# Patient Record
Sex: Male | Born: 1975
Health system: Southern US, Community
[De-identification: ages and names within clinical notes are randomized; demographics above are authoritative.]

## PROBLEM LIST (undated history)

## (undated) DIAGNOSIS — K5792 Diverticulitis of intestine, part unspecified, without perforation or abscess without bleeding: Secondary | ICD-10-CM

## (undated) DIAGNOSIS — M109 Gout, unspecified: Secondary | ICD-10-CM

## (undated) HISTORY — DX: Gout, unspecified: M10.9

## (undated) HISTORY — PX: HERNIA REPAIR: SHX51

## (undated) HISTORY — DX: Diverticulitis of intestine, part unspecified, without perforation or abscess without bleeding: K57.92

---

## 1999-03-23 ENCOUNTER — Ambulatory Visit (HOSPITAL_COMMUNITY): Admission: RE | Admit: 1999-03-23 | Discharge: 1999-03-23 | Payer: Self-pay | Admitting: Surgery

## 2001-01-29 ENCOUNTER — Ambulatory Visit (HOSPITAL_COMMUNITY): Admission: RE | Admit: 2001-01-29 | Discharge: 2001-01-29 | Payer: Self-pay | Admitting: Surgery

## 2006-06-29 ENCOUNTER — Emergency Department (HOSPITAL_COMMUNITY): Admission: EM | Admit: 2006-06-29 | Discharge: 2006-06-29 | Payer: Self-pay | Admitting: Emergency Medicine

## 2007-09-26 IMAGING — CT CT PELVIS W/ CM
2 of 5 series · 17 of 46 positions shown, 19 images · IV contrast ([ID]/WATER & 150 ML OMNI 300)
Comparison: none

CLINICAL DATA: Lower abdominal pain, fever and nausea.      
 ABDOMEN CT WITH CONTRAST:
TECHNIQUE: Multidetector CT imaging of the abdomen was performed following the standard protocol during bolus administration of intravenous contrast.
 Contrast:  Dilute oral contrast and IV administration of 150 cc Omnipaque 300.
TECHNIQUE: Multidetector CT imaging of the pelvis was performed following the standard protocol during bolus administration of intravenous contrast.

[Series 2: routine abdomen · axial · 0.98mm/px · z∈[-524,-30]mm · 14 of 111 slices shown, 16 images]
[im 6/111  soft-tissue]
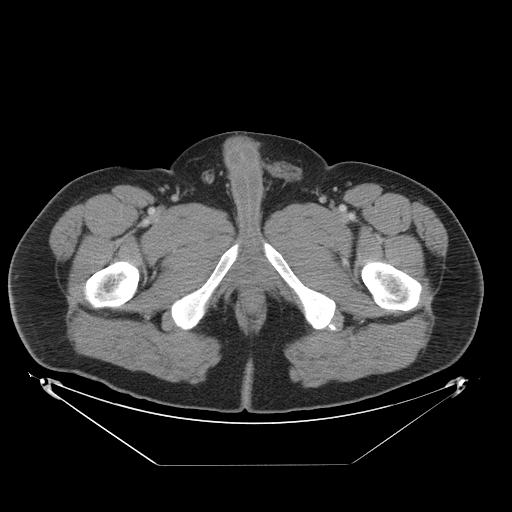
[im 6/111  bone]
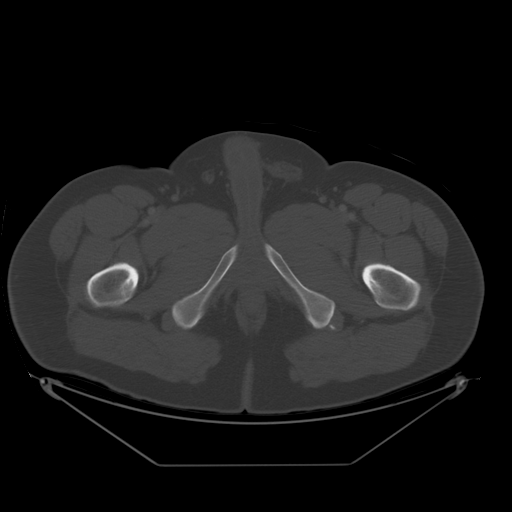
[im 17/111  soft-tissue]
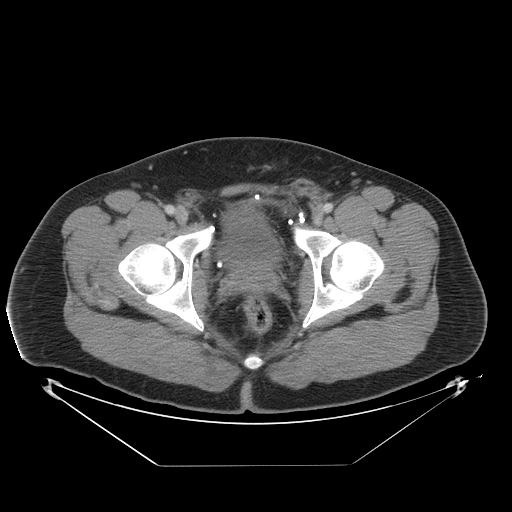
[im 23/111  soft-tissue]
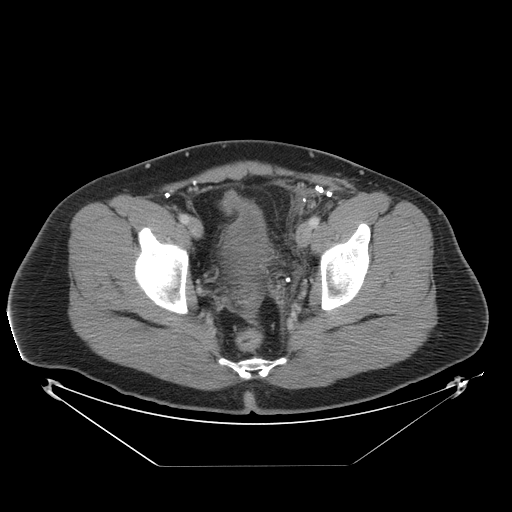
[im 28/111  soft-tissue]
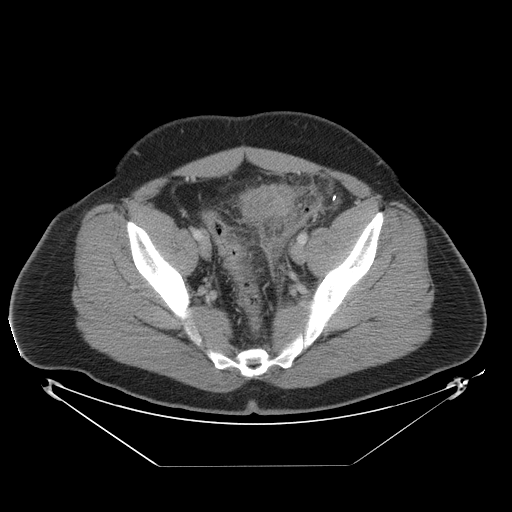
[im 39/111  soft-tissue]
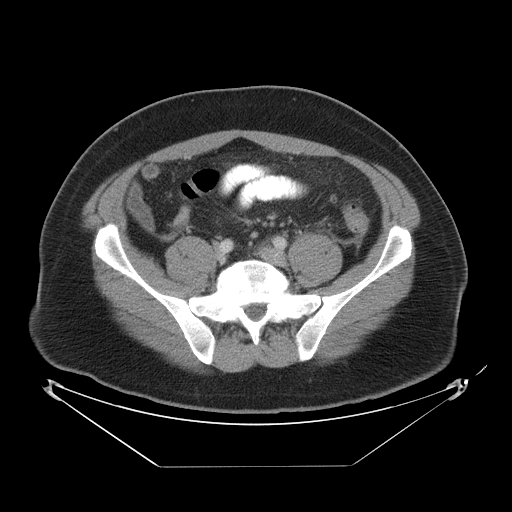
[im 45/111  soft-tissue]
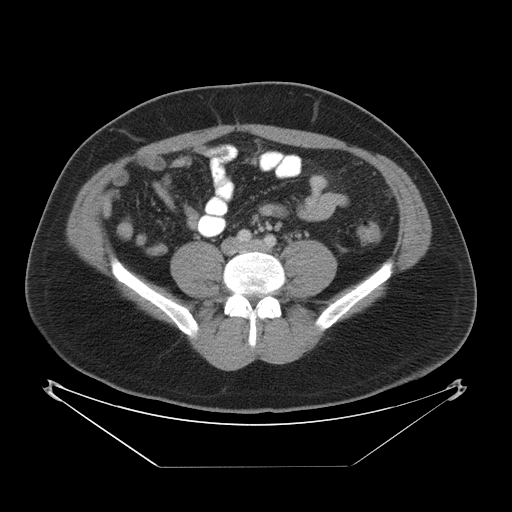
[im 50/111  soft-tissue]
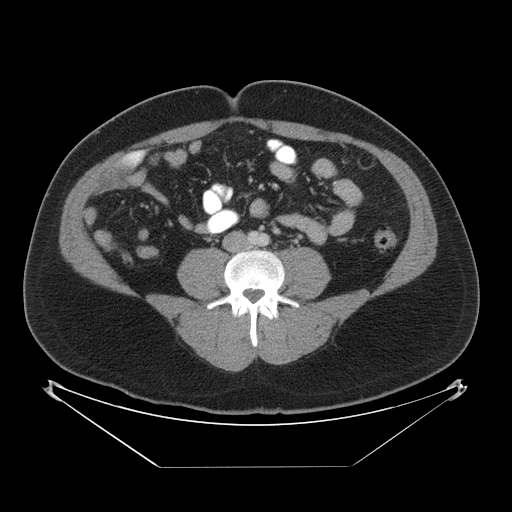
[im 61/111  soft-tissue]
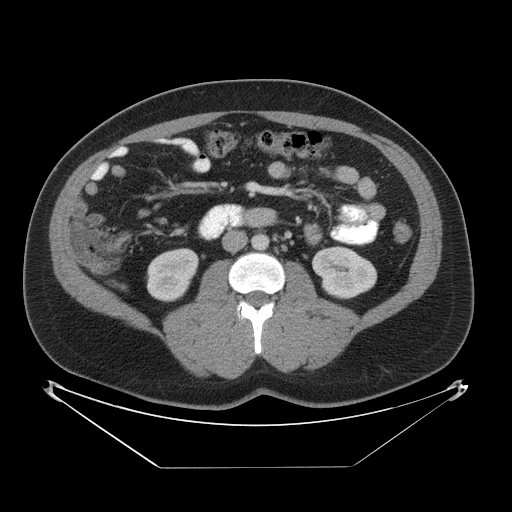
[im 67/111  soft-tissue]
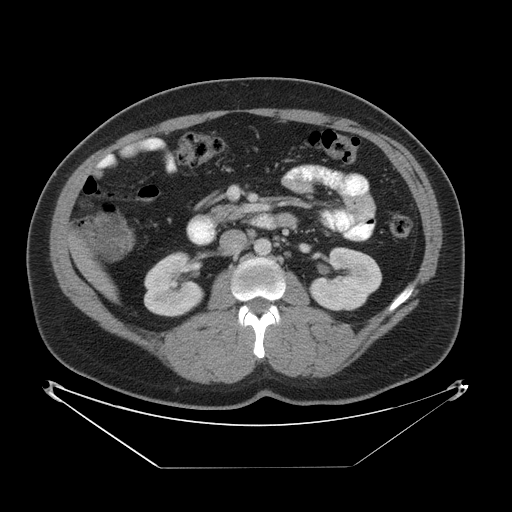
[im 67/111  bone]
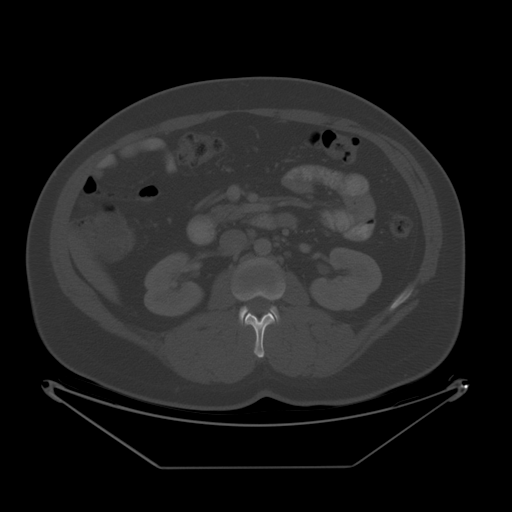
[im 72/111  soft-tissue]
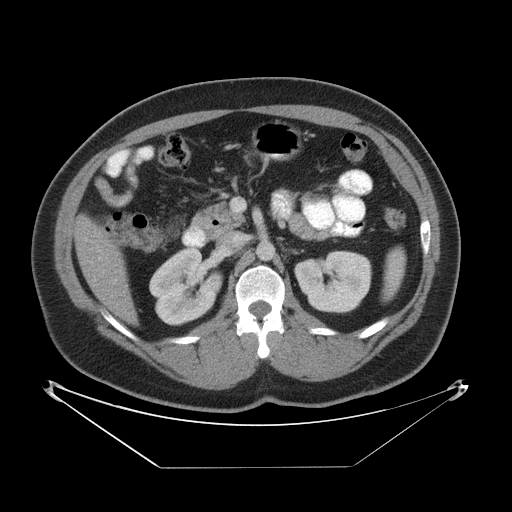
[im 83/111  soft-tissue]
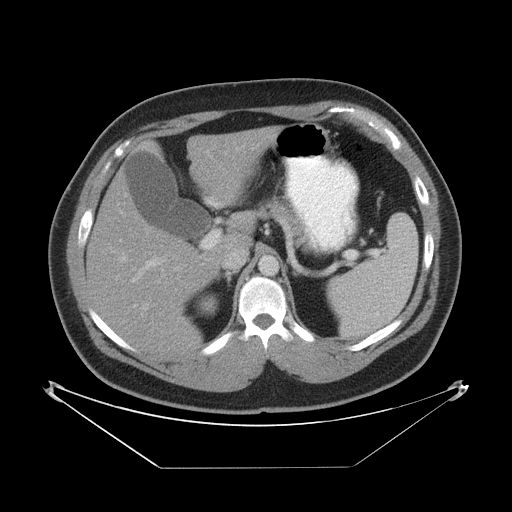
[im 89/111  soft-tissue]
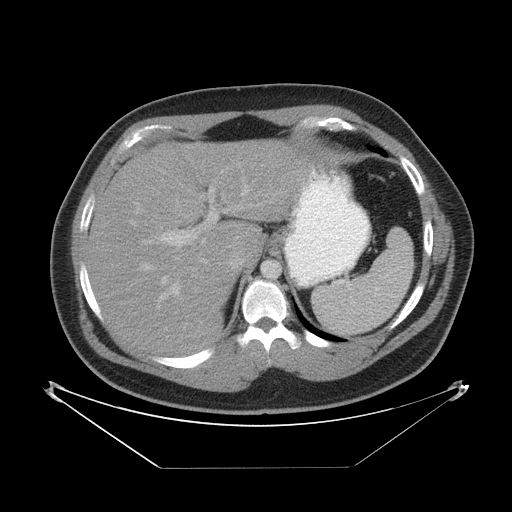
[im 94/111  soft-tissue]
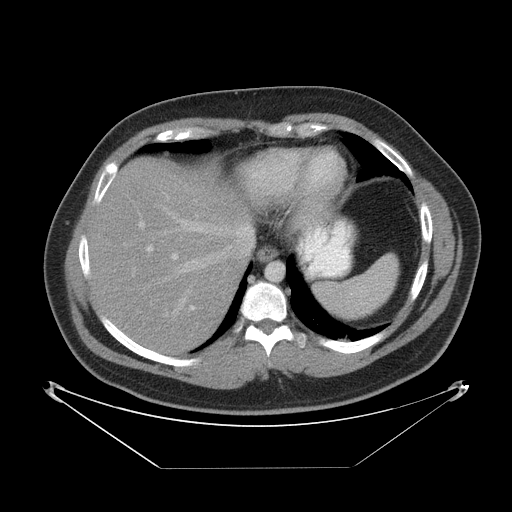
[im 105/111  soft-tissue]
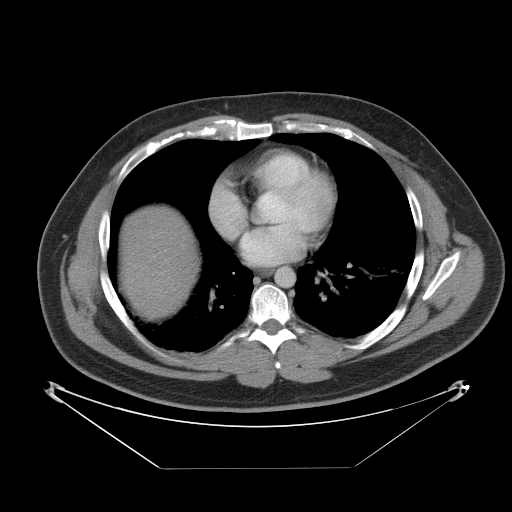

[Series 400: coronal abd · coronal · 1.09mm/px · 3 of 61 slices shown]
[im 21/61  soft-tissue]
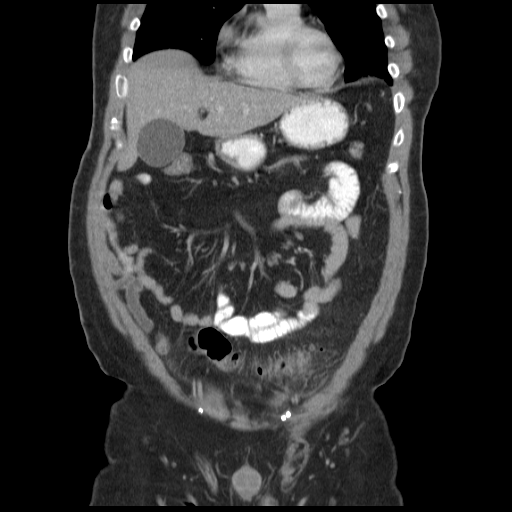
[im 27/61  soft-tissue]
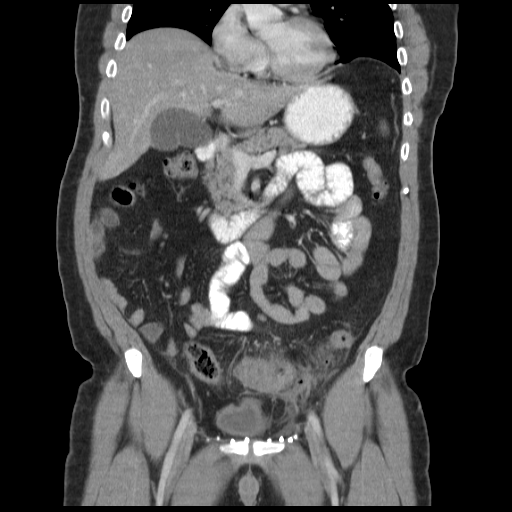
[im 34/61  soft-tissue]
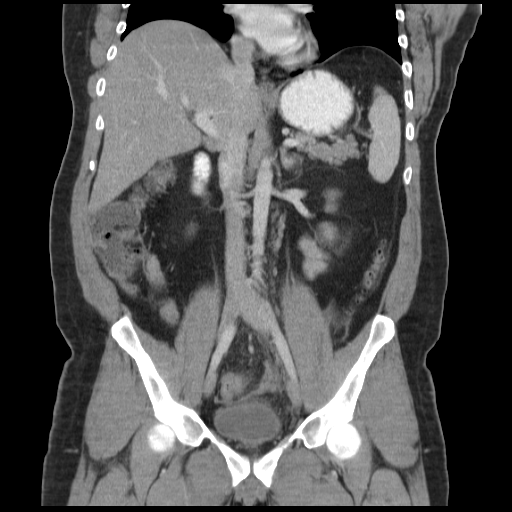

[17 of 46 positions shown; findings below may reference images not displayed]

FINDINGS: Lung bases clear.  Liver spleen, pancreas, adrenal glands, and kidneys normal.  Pancreas unremarkable.  Gallbladder is unremarkable.  The aorta and vasculature are unremarkable.  Mesentery and retroperitoneum unremarkable.
IMPRESSION: CT abdomen negative. 
 PELVIS CT WITH CONTRAST:
FINDINGS: Diffuse sigmoid diverticular changes.  Pericolonic mesenteric fatty stranding is consistent with acute diverticulitis.  There is no organized abscess or focal fluid collection seen.  Post-surgical changes secondary to hernia repair identified.  No acute hernia or evidence for bowel obstruction.
IMPRESSION: Uncomplicated acute diverticulitis, sigmoid colon.

## 2017-12-24 ENCOUNTER — Encounter: Payer: Self-pay | Admitting: Physician Assistant

## 2017-12-24 ENCOUNTER — Ambulatory Visit: Payer: BLUE CROSS/BLUE SHIELD | Admitting: Physician Assistant

## 2017-12-24 VITALS — BP 148/88 | HR 84 | Temp 97.7°F | Ht 73.0 in | Wt 299.0 lb

## 2017-12-24 DIAGNOSIS — M109 Gout, unspecified: Secondary | ICD-10-CM | POA: Diagnosis not present

## 2017-12-24 DIAGNOSIS — I10 Essential (primary) hypertension: Secondary | ICD-10-CM | POA: Diagnosis not present

## 2017-12-24 DIAGNOSIS — E1159 Type 2 diabetes mellitus with other circulatory complications: Secondary | ICD-10-CM | POA: Insufficient documentation

## 2017-12-24 MED ORDER — LISINOPRIL 10 MG PO TABS: 10.0000 mg | ORAL_TABLET | Freq: Every day | ORAL | 2 refills | Status: DC

## 2017-12-24 MED ORDER — LISINOPRIL 10 MG PO TABS: 10.0000 mg | ORAL_TABLET | Freq: Every day | ORAL | 5 refills | Status: DC

## 2017-12-24 NOTE — Progress Notes (Signed)
BP (!) 148/88   Pulse 84   Temp 97.7 F (36.5 C) (Oral)   Ht  (1.854 m)   Wt 299 lb (135.6 kg)   BMI 39.45 kg/m    Subjective:    Patient ID: Scott Weiss, male    DOB: 11-15-1975, 42 y.o.   MRN: [id number]  HPI: Scott Weiss is a 42 y.o. male presenting on 12/24/2017 for reestablish care Patient comes in to be reestablished for his chronic conditions.  He has had episodes of gout.  He controls this with colchicine.  He still has some at home.  Overall he states he has not had very many episodes at all.  He also needs to have his hypertension treated.  He does not remove what he is taking in the past.  Most recently he did have a sister died of liver cancer.  She was a patient here  also.   Past Medical History:  Diagnosis Date  . Diverticulitis   . Gout    Relevant past medical, surgical, family and social history reviewed and updated as indicated. Interim medical history since our last visit reviewed. Allergies and medications reviewed and updated. DATA REVIEWED: CHART IN EPIC  Family History reviewed for pertinent findings.  Review of Systems  Constitutional: Negative.  Negative for appetite change and fatigue.  HENT: Negative.   Eyes: Negative.  Negative for pain and visual disturbance.  Respiratory: Negative.  Negative for cough, chest tightness, shortness of breath and wheezing.   Cardiovascular: Negative.  Negative for chest pain, palpitations and leg swelling.  Gastrointestinal: Negative.  Negative for abdominal pain, diarrhea, nausea and vomiting.  Endocrine: Negative.   Genitourinary: Negative.   Musculoskeletal: Negative.   Skin: Negative.  Negative for color change and rash.  Neurological: Negative.  Negative for weakness, numbness and headaches.  Psychiatric/Behavioral: Negative.     Allergies as of 12/24/2017   No Known Allergies     Medication List        Accurate as of 12/24/17  4:07 PM. Always use your most recent med list.            lisinopril 10 MG tablet Commonly known as:  PRINIVIL,ZESTRIL Take 1 tablet (10 mg total) by mouth daily.          Objective:    BP (!) 148/88   Pulse 84   Temp 97.7 F (36.5 C) (Oral)   Ht  (1.854 m)   Wt 299 lb (135.6 kg)   BMI 39.45 kg/m   No Known Allergies  Wt Readings from Last 3 Encounters:  12/24/17 299 lb (135.6 kg)    Physical Exam  Constitutional: He appears well-developed and well-nourished. No distress.  HENT:  Head: Normocephalic and atraumatic.  Eyes: Pupils are equal, round, and reactive to light. Conjunctivae and EOM are normal.  Cardiovascular: Normal rate, regular rhythm and normal heart sounds.  Pulmonary/Chest: Effort normal and breath sounds normal. No respiratory distress.  Skin: Skin is warm and dry.  Psychiatric: He has a normal mood and affect. His behavior is normal.  Nursing note and vitals reviewed.   No results found for this or any previous visit.    Assessment & Plan:   1. Gout, unspecified cause, unspecified chronicity, unspecified site Continue colchicine which she has at home.  2. Essential hypertension - lisinopril (PRINIVIL,ZESTRIL) 10 MG tablet; Take 1 tablet (10 mg total) by mouth daily.  Dispense: 30 tablet; Refill: 5    Continue all other  maintenance medications as listed above.  Follow up plan: Return in about 6 months (around 06/26/2018).  Educational handout given for survey  Remus Loffler PA-C [facility] [address]  [place], [place] [address] [phone]   12/24/2017, 4:07 PM

## 2018-05-06 DIAGNOSIS — Z6839 Body mass index (BMI) 39.0-39.9, adult: Secondary | ICD-10-CM | POA: Diagnosis not present

## 2018-05-06 DIAGNOSIS — S29012A Strain of muscle and tendon of back wall of thorax, initial encounter: Secondary | ICD-10-CM | POA: Diagnosis not present

## 2018-10-19 ENCOUNTER — Ambulatory Visit (INDEPENDENT_AMBULATORY_CARE_PROVIDER_SITE_OTHER): Payer: BLUE CROSS/BLUE SHIELD | Admitting: Physician Assistant

## 2018-10-19 ENCOUNTER — Encounter: Payer: Self-pay | Admitting: Physician Assistant

## 2018-10-19 VITALS — BP 144/89 | HR 81 | Temp 99.0°F | Ht 73.0 in | Wt 317.2 lb

## 2018-10-19 DIAGNOSIS — Z0000 Encounter for general adult medical examination without abnormal findings: Secondary | ICD-10-CM | POA: Insufficient documentation

## 2018-10-19 DIAGNOSIS — I10 Essential (primary) hypertension: Secondary | ICD-10-CM | POA: Diagnosis not present

## 2018-10-19 DIAGNOSIS — J0110 Acute frontal sinusitis, unspecified: Secondary | ICD-10-CM | POA: Diagnosis not present

## 2018-10-19 DIAGNOSIS — Z0001 Encounter for general adult medical examination with abnormal findings: Secondary | ICD-10-CM | POA: Diagnosis not present

## 2018-10-19 MED ORDER — LISINOPRIL 20 MG PO TABS: 20.0000 mg | ORAL_TABLET | Freq: Every day | ORAL | 1 refills | Status: DC

## 2018-10-19 MED ORDER — LISINOPRIL 10 MG PO TABS: 10.0000 mg | ORAL_TABLET | Freq: Every day | ORAL | 5 refills | Status: DC

## 2018-10-19 MED ORDER — AMOXICILLIN 500 MG PO CAPS
500.0000 mg | ORAL_CAPSULE | Freq: Three times a day (TID) | ORAL | 0 refills | Status: DC
Start: 2018-10-19 — End: 2020-09-18

## 2018-10-19 NOTE — Progress Notes (Signed)
BP (!) 144/89   Pulse 81   Temp 99 F (37.2 C) (Oral)   Ht _0  (1.854 m)   Wt (!) 317 lb 3.2 oz (143.9 kg)   BMI 41.85 kg/m    Subjective:    Patient ID: Scott Weiss, male    DOB: 03-18-1976, 43 y.o.   MRN: [id number]  HPI: Scott Weiss is a 43 y.o. male presenting on 10/19/2018 for Annual Exam; Cough; and Sinusitis  This patient comes in for annual well physical examination. All medications are reviewed today. There are no reports of any problems with the medications. All of the medical conditions are reviewed and updated.  Lab work is reviewed and will be ordered as medically necessary. There are no new problems reported with today's visit.  Patient reports doing well overall. Hypertension medications need refilling, he has been off of them for some time.   This patient has had many days of sinus headache and postnasal drainage. There is copious drainage at times. Denies any fever at this time. There has been a history of sinus infections in the past.  No history of sinus surgery. There is cough at night. It has become more prevalent in recent days.   Past Medical History:  Diagnosis Date  . Diverticulitis   . Gout    Relevant past medical, surgical, family and social history reviewed and updated as indicated. Interim medical history since our last visit reviewed. Allergies and medications reviewed and updated. DATA REVIEWED: CHART IN EPIC  Family History reviewed for pertinent findings.  Review of Systems  Constitutional: Positive for fatigue. Negative for appetite change and fever.  HENT: Positive for sinus pressure and sore throat.   Eyes: Negative.  Negative for pain and visual disturbance.  Respiratory: Positive for wheezing. Negative for cough, chest tightness and shortness of breath.   Cardiovascular: Negative.  Negative for chest pain, palpitations and leg swelling.  Gastrointestinal: Negative.  Negative for abdominal pain, diarrhea, nausea and vomiting.    Endocrine: Negative.   Genitourinary: Negative.   Musculoskeletal: Negative for myalgias.  Skin: Negative.  Negative for color change and rash.  Neurological: Positive for headaches. Negative for weakness and numbness.  Psychiatric/Behavioral: Negative.     Allergies as of 10/19/2018   No Known Allergies     Medication List       Accurate as of October 19, 2018 10:07 PM. Always use your most recent med list.        amoxicillin 500 MG capsule Commonly known as:  AMOXIL Take 1 capsule (500 mg total) by mouth 3 (three) times daily.   lisinopril 20 MG tablet Commonly known as:  PRINIVIL,ZESTRIL Take 1 tablet (20 mg total) by mouth daily. NEW DOSE          Objective:    BP (!) 144/89   Pulse 81   Temp 99 F (37.2 C) (Oral)   Ht _1  (1.854 m)   Wt (!) 317 lb 3.2 oz (143.9 kg)   BMI 41.85 kg/m   No Known Allergies  Wt Readings from Last 3 Encounters:  10/19/18 (!) 317 lb 3.2 oz (143.9 kg)  12/24/17 299 lb (135.6 kg)    Physical Exam Constitutional:      Appearance: He is well-developed.  HENT:     Head: Normocephalic and atraumatic.     Right Ear: Tympanic membrane and external ear normal. No middle ear effusion.     Left Ear: Tympanic membrane and external ear normal.  No middle ear effusion.     Nose: Mucosal edema and rhinorrhea present.     Right Sinus: No maxillary sinus tenderness.     Left Sinus: No maxillary sinus tenderness.     Mouth/Throat:     Pharynx: Uvula midline. Posterior oropharyngeal erythema present.  Eyes:     General:        Right eye: No discharge.        Left eye: No discharge.     Conjunctiva/sclera: Conjunctivae normal.     Pupils: Pupils are equal, round, and reactive to light.  Neck:     Musculoskeletal: Normal range of motion and neck supple.  Cardiovascular:     Rate and Rhythm: Normal rate and regular rhythm.     Heart sounds: Normal heart sounds.  Pulmonary:     Effort: Pulmonary effort is normal. No respiratory distress.      Breath sounds: Normal breath sounds. No wheezing.  Abdominal:     General: Bowel sounds are normal.     Palpations: Abdomen is soft.  Musculoskeletal: Normal range of motion.  Lymphadenopathy:     Cervical: No cervical adenopathy.  Skin:    General: Skin is warm and dry.  Neurological:     Mental Status: He is alert and oriented to person, place, and time.     No results found for this or any previous visit.    Assessment & Plan:   1. Well adult exam - CBC with Differential/Platelet; Future - CMP14+EGFR; Future - Lipid panel; Future - TSH; Future  2. Essential hypertension - lisinopril (PRINIVIL,ZESTRIL) 20 MG tablet; Take 1 tablet (20 mg total) by mouth daily. NEW DOSE  Dispense: 90 tablet; Refill: 1  3. Acute non-recurrent frontal sinusitis - amoxicillin (AMOXIL) 500 MG capsule; Take 1 capsule (500 mg total) by mouth 3 (three) times daily.  Dispense: 30 capsule; Refill: 0   Continue all other maintenance medications as listed above.  Follow up plan: Return in about 6 weeks (around 11/30/2018) for recheck.  Educational handout given for [place] PA-C [facility] [address]  [place], [place] [address] [phone]   10/19/2018, 10:07 PM

## 2018-10-20 ENCOUNTER — Encounter: Payer: BLUE CROSS/BLUE SHIELD | Admitting: Physician Assistant

## 2019-01-21 ENCOUNTER — Encounter (INDEPENDENT_AMBULATORY_CARE_PROVIDER_SITE_OTHER): Payer: Self-pay

## 2019-04-09 ENCOUNTER — Other Ambulatory Visit: Payer: Self-pay | Admitting: Physician Assistant

## 2019-04-09 DIAGNOSIS — I10 Essential (primary) hypertension: Secondary | ICD-10-CM

## 2019-04-30 ENCOUNTER — Ambulatory Visit: Payer: BLUE CROSS/BLUE SHIELD | Admitting: Physician Assistant

## 2020-09-18 ENCOUNTER — Ambulatory Visit: Payer: BC Managed Care – PPO | Admitting: Family Medicine

## 2020-09-18 ENCOUNTER — Other Ambulatory Visit: Payer: Self-pay

## 2020-09-18 ENCOUNTER — Encounter: Payer: Self-pay | Admitting: Family Medicine

## 2020-09-18 VITALS — BP 151/95 | HR 81 | Temp 97.9°F | Ht 73.0 in | Wt 333.4 lb

## 2020-09-18 DIAGNOSIS — R252 Cramp and spasm: Secondary | ICD-10-CM | POA: Diagnosis not present

## 2020-09-18 DIAGNOSIS — M109 Gout, unspecified: Secondary | ICD-10-CM

## 2020-09-18 DIAGNOSIS — I10 Essential (primary) hypertension: Secondary | ICD-10-CM | POA: Diagnosis not present

## 2020-09-18 LAB — CBC WITH DIFFERENTIAL/PLATELET
Basos: 1 %
EOS (ABSOLUTE): 0.2 10*3/uL (ref 0.0–0.4)
Hematocrit: 50.2 % (ref 37.5–51.0)
Hemoglobin: 16.9 g/dL (ref 13.0–17.7)
Immature Grans (Abs): 0 10*3/uL (ref 0.0–0.1)
MCH: 29.2 pg (ref 26.6–33.0)
Monocytes Absolute: 0.5 10*3/uL (ref 0.1–0.9)
Platelets: 279 10*3/uL (ref 150–450)
RBC: 5.79 x10E6/uL (ref 4.14–5.80)
RDW: 12.4 % (ref 11.6–15.4)
WBC: 6.2 10*3/uL (ref 3.4–10.8)

## 2020-09-18 LAB — CMP14+EGFR

## 2020-09-18 LAB — LIPID PANEL

## 2020-09-18 MED ORDER — LISINOPRIL 20 MG PO TABS: 20.0000 mg | ORAL_TABLET | Freq: Every day | ORAL | 1 refills | Status: DC

## 2020-09-18 MED ORDER — ALLOPURINOL 100 MG PO TABS: 100.0000 mg | ORAL_TABLET | Freq: Every day | ORAL | 3 refills | Status: DC

## 2020-09-18 NOTE — Patient Instructions (Signed)
DASH Eating Plan DASH stands for Dietary Approaches to Stop Hypertension. The DASH eating plan is a healthy eating plan that has been shown to:  Reduce high blood pressure (hypertension).  Reduce your risk for type 2 diabetes, heart disease, and stroke.  Help with weight loss. What are tips for following this plan? Reading food labels  Check food labels for the amount of salt (sodium) per serving. Choose foods with less than 5 percent of the Daily Value of sodium. Generally, foods with less than 300 milligrams (mg) of sodium per serving fit into this eating plan.  To find whole grains, look for the word "whole" as the first word in the ingredient list. Shopping  Buy products labeled as "low-sodium" or "no salt added."  Buy fresh foods. Avoid canned foods and pre-made or frozen meals. Cooking  Avoid adding salt when cooking. Use salt-free seasonings or herbs instead of table salt or sea salt. Check with your health care provider or pharmacist before using salt substitutes.  Do not fry foods. Cook foods using healthy methods such as baking, boiling, grilling, roasting, and broiling instead.  Cook with heart-healthy oils, such as olive, canola, avocado, soybean, or sunflower oil. Meal planning  Eat a balanced diet that includes: ? 4 or more servings of fruits and 4 or more servings of vegetables each day. Try to fill one-half of your plate with fruits and vegetables. ? 6-8 servings of whole grains each day. ? Less than 6 oz (170 g) of lean meat, poultry, or fish each day. A 3-oz (85-g) serving of meat is about the same size as a deck of cards. One egg equals 1 oz (28 g). ? 2-3 servings of low-fat dairy each day. One serving is 1 cup (237 mL). ? 1 serving of nuts, seeds, or beans 5 times each week. ? 2-3 servings of heart-healthy fats. Healthy fats called omega-3 fatty acids are found in foods such as walnuts, flaxseeds, fortified milks, and eggs. These fats are also found in  cold-water fish, such as sardines, salmon, and mackerel.  Limit how much you eat of: ? Canned or prepackaged foods. ? Food that is high in trans fat, such as some fried foods. ? Food that is high in saturated fat, such as fatty meat. ? Desserts and other sweets, sugary drinks, and other foods with added sugar. ? Full-fat dairy products.  Do not salt foods before eating.  Do not eat more than 4 egg yolks a week.  Try to eat at least 2 vegetarian meals a week.  Eat more home-cooked food and less restaurant, buffet, and fast food.   Lifestyle  When eating at a restaurant, ask that your food be prepared with less salt or no salt, if possible.  If you drink alcohol: ? Limit how much you use to:  0-1 drink a day for women who are not pregnant.  0-2 drinks a day for men. ? Be aware of how much alcohol is in your drink. In the [place] one drink equals one 12 oz bottle of beer (355 mL), one 5 oz glass of wine (148 mL), or one 1 oz glass of hard liquor (44 mL). General information  Avoid eating more than 2,300 mg of salt a day. If you have hypertension, you may need to reduce your sodium intake to 1,500 mg a day.  Work with your health care provider to maintain a healthy body weight or to lose weight. Ask what an ideal weight is for you.    Get at least 30 minutes of exercise that causes your heart to beat faster (aerobic exercise) most days of the week. Activities may include walking, swimming, or biking.  Work with your health care provider or dietitian to adjust your eating plan to your individual calorie needs. What foods should I eat? Fruits All fresh, dried, or frozen fruit. Canned fruit in natural juice (without added sugar). Vegetables Fresh or frozen vegetables (raw, steamed, roasted, or grilled). Low-sodium or reduced-sodium tomato and vegetable juice. Low-sodium or reduced-sodium tomato sauce and tomato paste. Low-sodium or reduced-sodium canned vegetables. Grains Whole-grain  or whole-wheat bread. Whole-grain or whole-wheat pasta. Brown rice. Oatmeal. Quinoa. Bulgur. Whole-grain and low-sodium cereals. Pita bread. Low-fat, low-sodium crackers. Whole-wheat flour tortillas. Meats and other proteins Skinless chicken or turkey. Ground chicken or turkey. Pork with fat trimmed off. Fish and seafood. Egg whites. Dried beans, peas, or lentils. Unsalted nuts, nut butters, and seeds. Unsalted canned beans. Lean cuts of beef with fat trimmed off. Low-sodium, lean precooked or cured meat, such as sausages or meat loaves. Dairy Low-fat (1%) or fat-free (skim) milk. Reduced-fat, low-fat, or fat-free cheeses. Nonfat, low-sodium ricotta or cottage cheese. Low-fat or nonfat yogurt. Low-fat, low-sodium cheese. Fats and oils Soft margarine without trans fats. Vegetable oil. Reduced-fat, low-fat, or light mayonnaise and salad dressings (reduced-sodium). Canola, safflower, olive, avocado, soybean, and sunflower oils. Avocado. Seasonings and condiments Herbs. Spices. Seasoning mixes without salt. Other foods Unsalted popcorn and pretzels. Fat-free sweets. The items listed above may not be a complete list of foods and beverages you can eat. Contact a dietitian for more information. What foods should I avoid? Fruits Canned fruit in a light or heavy syrup. Fried fruit. Fruit in cream or butter sauce. Vegetables Creamed or fried vegetables. Vegetables in a cheese sauce. Regular canned vegetables (not low-sodium or reduced-sodium). Regular canned tomato sauce and paste (not low-sodium or reduced-sodium). Regular tomato and vegetable juice (not low-sodium or reduced-sodium). Pickles. Olives. Grains Baked goods made with fat, such as croissants, muffins, or some breads. Dry pasta or rice meal packs. Meats and other proteins Fatty cuts of meat. Ribs. Fried meat. Bacon. Bologna, salami, and other precooked or cured meats, such as sausages or meat loaves. Fat from the back of a pig (fatback).  Bratwurst. Salted nuts and seeds. Canned beans with added salt. Canned or smoked fish. Whole eggs or egg yolks. Chicken or turkey with skin. Dairy Whole or 2% milk, cream, and half-and-half. Whole or full-fat cream cheese. Whole-fat or sweetened yogurt. Full-fat cheese. Nondairy creamers. Whipped toppings. Processed cheese and cheese spreads. Fats and oils Butter. Stick margarine. Lard. Shortening. Ghee. Bacon fat. Tropical oils, such as coconut, palm kernel, or palm oil. Seasonings and condiments Onion salt, garlic salt, seasoned salt, table salt, and sea salt. Worcestershire sauce. Tartar sauce. Barbecue sauce. Teriyaki sauce. Soy sauce, including reduced-sodium. Steak sauce. Canned and packaged gravies. Fish sauce. Oyster sauce. Cocktail sauce. Store-bought horseradish. Ketchup. Mustard. Meat flavorings and tenderizers. Bouillon cubes. Hot sauces. Pre-made or packaged marinades. Pre-made or packaged taco seasonings. Relishes. Regular salad dressings. Other foods Salted popcorn and pretzels. The items listed above may not be a complete list of foods and beverages you should avoid. Contact a dietitian for more information. Where to find more information  National Heart, Lung, and Blood Institute: www.nhlbi.nih.gov  American Heart Association: www.heart.org  Academy of Nutrition and Dietetics: www.eatright.org  National Kidney Foundation: www.kidney.org Summary  The DASH eating plan is a healthy eating plan that has been shown to reduce high   blood pressure (hypertension). It may also reduce your risk for type 2 diabetes, heart disease, and stroke.  When on the DASH eating plan, aim to eat more fresh fruits and vegetables, whole grains, lean proteins, low-fat dairy, and heart-healthy fats.  With the DASH eating plan, you should limit salt (sodium) intake to 2,300 mg a day. If you have hypertension, you may need to reduce your sodium intake to 1,500 mg a day.  Work with your health care  provider or dietitian to adjust your eating plan to your individual calorie needs. This information is not intended to replace advice given to you by your health care provider. Make sure you discuss any questions you have with your health care provider. Document Revised: 07/02/2019 Document Reviewed: 07/02/2019 Elsevier Patient Education  2021 [employer].   

## 2020-09-18 NOTE — Progress Notes (Signed)
Established Patient Office Visit  Subjective:  Patient ID: Scott Weiss, male    DOB: 11-Sep-1975  Age: 45 y.o. MRN: [id number]  CC:  Chief Complaint  Patient presents with  . Hypertension    HPI [name] presents for chronic follow up.   1. HTN Complaint with meds - No, he has been out for about 6 months Current Medications - was taking lisnopril  20 mg daily Checking BP at home: no Exercising Regularly - active at work, no organized exercise Watching Salt intake - Yes Pertinent ROS:  Headache - No Fatigue - No Visual Disturbances - No Chest pain - No Dyspnea - No Palpitations - No LE edema - No  Family, social, and smoking history reviewed.   BP Readings from Last 3 Encounters:  09/18/20 (!) 151/95  10/19/18 (!) 144/89  12/24/17 (!) 148/88   No flowsheet data found.   2. Gout [name] has a history of gout and take allopurinol daily. He has gout flares periodically, usually in his big toe.   3. Leg cramps Kairon reports leg cramps a couple times a week when he wakes in the morning. They are relieved by stretching. He tries to stay well hydrated. He has tried magnesium and potassium supplements without much improvement. He denies cramps with waking.    Past Medical History:  Diagnosis Date  . Diverticulitis   . Gout     Past Surgical History:  Procedure Laterality Date  . HERNIA REPAIR     groin    Family History  Problem Relation Age of Onset  . Cancer Mother   . Cirrhosis Mother   . COPD Father   . Liver cancer Sister     Social History   Socioeconomic History  . Marital status: Married    Spouse name: Not on file  . Number of children: Not on file  . Years of education: Not on file  . Highest education level: Not on file  Occupational History  . Not on file  Tobacco Use  . Smoking status: Never Smoker  . Smokeless tobacco: Never Used  Vaping Use  . Vaping Use: Never used  Substance and Sexual Activity  . Alcohol use: Never  .  Drug use: Never  . Sexual activity: Yes  Other Topics Concern  . Not on file  Social History Narrative  . Not on file   Social Determinants of Health   Financial Resource Strain: Not on file  Food Insecurity: Not on file  Transportation Needs: Not on file  Physical Activity: Not on file  Stress: Not on file  Social Connections: Not on file  Intimate Partner Violence: Not on file    Outpatient Medications Prior to Visit  Medication Sig Dispense Refill  . lisinopril (ZESTRIL) 20 MG tablet TAKE 1 TABLET (20 MG TOTAL) BY MOUTH DAILY. NEW DOSE (Patient not taking: Reported on 09/18/2020) 90 tablet 1  . amoxicillin (AMOXIL) 500 MG capsule Take 1 capsule (500 mg total) by mouth 3 (three) times daily. 30 capsule 0   No facility-administered medications prior to visit.    No Known Allergies  ROS Review of Systems Negative unless specially indicated above in HPI.   Objective:    Physical Exam Vitals and nursing note reviewed.  Constitutional:      Appearance: Normal appearance.  Neck:     Vascular: No carotid bruit.  Cardiovascular:     Rate and Rhythm: Normal rate and regular rhythm.     Heart  sounds: Normal heart sounds. No murmur heard.   Pulmonary:     Effort: Pulmonary effort is normal. No respiratory distress.     Breath sounds: Normal breath sounds.  Abdominal:     General: Bowel sounds are normal.     Palpations: Abdomen is soft.  Musculoskeletal:     Cervical back: Neck supple. No tenderness.     Right lower leg: No edema.     Left lower leg: No edema.  Skin:    General: Skin is warm and dry.  Neurological:     General: No focal deficit present.     Mental Status: He is alert and oriented to person, place, and time.  Psychiatric:        Mood and Affect: Mood normal.        Behavior: Behavior normal.     BP (!) 151/95   Pulse 81   Temp 97.9 F (36.6 C) (Temporal)   Ht _0  (1.854 m)   Wt (!) 333 lb 6 oz (151.2 kg)   BMI 43.98 kg/m  Wt Readings  from Last 3 Encounters:  09/18/20 (!) 333 lb 6 oz (151.2 kg)  10/19/18 (!) 317 lb 3.2 oz (143.9 kg)  12/24/17 299 lb (135.6 kg)     Health Maintenance Due  Topic Date Due  . Hepatitis C Screening  Never done  . HIV Screening  Never done  . COLONOSCOPY (Pts 45-70yr Insurance coverage will need to be confirmed)  Never done    There are no preventive care reminders to display for this patient.  No results found for: TSH No results found for: WBC, HGB, HCT, MCV, PLT No results found for: NA, K, CHLORIDE, CO2, GLUCOSE, BUN, CREATININE, BILITOT, ALKPHOS, AST, ALT, PROT, ALBUMIN, CALCIUM, ANIONGAP, EGFR, GFR No results found for: CHOL No results found for: HDL No results found for: LDLCALC No results found for: TRIG No results found for: CHOLHDL No results found for: HGBA1C    Assessment & Plan:   Scott Weiss was seen today for hypertension.  Diagnoses and all orders for this visit:  Essential hypertension Elevated BP, however patient has been out of medication for 6 months. Restarted lisinopril. Labs pending as below. Goal BP 130/80s or less. Diet and exercise.  -     CBC with Differential/Platelet -     CMP14+EGFR -     Lipid panel -     TSH -     lisinopril (ZESTRIL) 20 MG tablet; Take 1 tablet (20 mg total) by mouth daily. NEW DOSE  Morbid obesity ([place] Diet and exercise. Labs pending as below.  -     CBC with Differential/Platelet -     CMP14+EGFR -     Lipid panel -     TSH  Gout, unspecified cause, unspecified chronicity, unspecified site Uric acid level pending. Restart allopurinol.  -     allopurinol (ZYLOPRIM) 100 MG tablet; Take 1 tablet (100 mg total) by mouth daily. -     Uric Acid  Leg cramp Labs pending as below. Stay well hydrated, stretch.  -     CBC with Differential/Platelet -     CMP14+EGFR   Follow-up: Return in about 3 months (around 12/16/2020) for chronic follow up.   The patient indicates understanding of these issues and agrees with the  plan.  [name] FNP

## 2020-09-19 LAB — CMP14+EGFR
ALT: 48 IU/L — ABNORMAL HIGH (ref 0–44)
AST: 39 IU/L (ref 0–40)
Albumin/Globulin Ratio: 1.8 (ref 1.2–2.2)
BUN/Creatinine Ratio: 13 (ref 9–20)
CO2: 22 mmol/L (ref 20–29)
Calcium: 9 mg/dL (ref 8.7–10.2)
Chloride: 102 mmol/L (ref 96–106)
GFR calc Af Amer: 120 mL/min/{1.73_m2} (ref >59)
Globulin, Total: 2.4 g/dL (ref 1.5–4.5)
Glucose: 109 mg/dL — ABNORMAL HIGH (ref 65–99)
Potassium: 4.7 mmol/L (ref 3.5–5.2)
Sodium: 141 mmol/L (ref 134–144)

## 2020-09-19 LAB — CBC WITH DIFFERENTIAL/PLATELET
Basophils Absolute: 0.1 10*3/uL (ref 0.0–0.2)
Eos: 3 %
Immature Granulocytes: 1 %
Lymphocytes Absolute: 2.2 10*3/uL (ref 0.7–3.1)
Lymphs: 36 %
MCHC: 33.7 g/dL (ref 31.5–35.7)
MCV: 87 fL (ref 79–97)
Monocytes: 7 %
Neutrophils Absolute: 3.2 10*3/uL (ref 1.4–7.0)
Neutrophils: 52 %

## 2020-09-19 LAB — LIPID PANEL
Chol/HDL Ratio: 5.2 ratio — ABNORMAL HIGH (ref 0.0–5.0)
Cholesterol, Total: 217 mg/dL — ABNORMAL HIGH (ref 100–199)
HDL: 42 mg/dL (ref >39)
LDL Chol Calc (NIH): 147 mg/dL — ABNORMAL HIGH (ref 0–99)
Triglycerides: 157 mg/dL — ABNORMAL HIGH (ref 0–149)

## 2020-09-19 LAB — URIC ACID: Uric Acid: 7.1 mg/dL (ref 3.8–8.4)

## 2020-09-19 LAB — TSH: TSH: 1.2 u[IU]/mL (ref 0.450–4.500)

## 2020-12-19 ENCOUNTER — Encounter: Payer: Self-pay | Admitting: Family Medicine

## 2020-12-19 ENCOUNTER — Ambulatory Visit: Payer: BC Managed Care – PPO | Admitting: Family Medicine

## 2020-12-19 ENCOUNTER — Other Ambulatory Visit: Payer: Self-pay

## 2020-12-19 VITALS — BP 136/81 | HR 86 | Temp 98.1°F | Ht 73.0 in | Wt 333.5 lb

## 2020-12-19 DIAGNOSIS — R739 Hyperglycemia, unspecified: Secondary | ICD-10-CM | POA: Diagnosis not present

## 2020-12-19 DIAGNOSIS — E1169 Type 2 diabetes mellitus with other specified complication: Secondary | ICD-10-CM | POA: Insufficient documentation

## 2020-12-19 DIAGNOSIS — M791 Myalgia, unspecified site: Secondary | ICD-10-CM | POA: Diagnosis not present

## 2020-12-19 DIAGNOSIS — M109 Gout, unspecified: Secondary | ICD-10-CM | POA: Diagnosis not present

## 2020-12-19 DIAGNOSIS — I10 Essential (primary) hypertension: Secondary | ICD-10-CM | POA: Diagnosis not present

## 2020-12-19 DIAGNOSIS — R252 Cramp and spasm: Secondary | ICD-10-CM

## 2020-12-19 DIAGNOSIS — E785 Hyperlipidemia, unspecified: Secondary | ICD-10-CM | POA: Diagnosis not present

## 2020-12-19 LAB — ARTHRITIS PANEL
Basos: 1 %
EOS (ABSOLUTE): 0.2 10*3/uL (ref 0.0–0.4)
Eos: 3 %
Immature Granulocytes: 1 %
MCV: 88 fL (ref 79–97)
Platelets: 311 10*3/uL (ref 150–450)

## 2020-12-19 LAB — CMP14+EGFR
Bilirubin Total: 0.5 mg/dL (ref 0.0–1.2)
Chloride: 98 mmol/L (ref 96–106)
Potassium: 4.6 mmol/L (ref 3.5–5.2)
eGFR: 113 mL/min/{1.73_m2} (ref >59)

## 2020-12-19 LAB — LIPID PANEL

## 2020-12-19 MED ORDER — PHENTERMINE HCL 37.5 MG PO TABS: 37.5000 mg | ORAL_TABLET | Freq: Every day | ORAL | 0 refills | Status: DC

## 2020-12-19 MED ORDER — TIZANIDINE HCL 4 MG PO TABS: 4.0000 mg | ORAL_TABLET | Freq: Four times a day (QID) | ORAL | 0 refills | Status: DC | PRN

## 2020-12-19 NOTE — Progress Notes (Signed)
Established Patient Office Visit  Subjective:  Patient ID: JAVEN HINDERLITER, male    DOB: 1976-04-27  Age: 45 y.o. MRN: [id number]  CC:  Chief Complaint  Patient presents with  . Hypertension    HPI Yadier E Livesey presents for chronic follow up.   1. HTN Complaint with meds - yes, lisinopril 20 mg Current Medications -  Checking BP at home: no Exercising Regularly - active at work, no organized exercise Watching Salt intake - Yes Pertinent ROS:  Headache - No Fatigue - No Visual Disturbances - No Chest pain - No Dyspnea - No Palpitations - No LE edema - No  Family, social, and smoking history reviewed.   BP Readings from Last 3 Encounters:  12/19/20 136/81  09/18/20 (!) 151/95  10/19/18 (!) 144/89   CMP Latest Ref Rng & Units 09/18/2020  Glucose 65 - 99 mg/dL 109(H)  BUN 6 - 24 mg/dL 11  Creatinine 0.76 - 1.27 mg/dL 0.86  Sodium 134 - 144 mmol/L 141  Potassium 3.5 - 5.2 mmol/L 4.7  Chloride 96 - 106 mmol/L 102  CO2 20 - 29 mmol/L 22  Calcium 8.7 - 10.2 mg/dL 9.0  Total Protein 6.0 - 8.5 g/dL 6.6  Total Bilirubin 0.0 - 1.2 mg/dL 0.7  Alkaline Phos 44 - 121 IU/L 73  AST 0 - 40 IU/L 39  ALT 0 - 44 IU/L 48(H)     2. Gout Keoki has a history of gout and take allopurinol daily. He has gout flares periodically, usually in his big toe. He tries to watch to watch his diet for trigger foods.   3. Leg pain Gevin reports pain in both of his lower legs. This has been present for awhile. He repots an ache. The pain is in the back of his calves. He occasionally has some pain in his right heel. He stands all day at work. The pain in his legs in constant throughout the day. The pain is a 4-5/10. It is an annoyance. He often has cramps in his legs. The pain is better with ibuprofen and tylenol. CoQ10 and a joint health supplement has been a little helpful as well.  Denies swelling or warmth. Denies pain or cramping with walking. He stands on his feet for long periods of time for  work. He also tries to stretch daily.   4. Obesity Milad would like to try medication to help him loose weight. He tries to watch his diet. He has difficulty exercising due to time constraints due to work. He has tried phentermine in the past and would like to try this again.   Past Medical History:  Diagnosis Date  . Diverticulitis   . Gout     Past Surgical History:  Procedure Laterality Date  . HERNIA REPAIR     groin    Family History  Problem Relation Age of Onset  . Cancer Mother   . Cirrhosis Mother   . COPD Father   . Liver cancer Sister     Social History   Socioeconomic History  . Marital status: Married    Spouse name: Not on file  . Number of children: Not on file  . Years of education: Not on file  . Highest education level: Not on file  Occupational History  . Not on file  Tobacco Use  . Smoking status: Never Smoker  . Smokeless tobacco: Never Used  Vaping Use  . Vaping Use: Never used  Substance and Sexual Activity  .  Alcohol use: Never  . Drug use: Never  . Sexual activity: Yes  Other Topics Concern  . Not on file  Social History Narrative  . Not on file   Social Determinants of Health   Financial Resource Strain: Not on file  Food Insecurity: Not on file  Transportation Needs: Not on file  Physical Activity: Not on file  Stress: Not on file  Social Connections: Not on file  Intimate Partner Violence: Not on file    Outpatient Medications Prior to Visit  Medication Sig Dispense Refill  . allopurinol (ZYLOPRIM) 100 MG tablet Take 1 tablet (100 mg total) by mouth daily. 90 tablet 3  . Cholecalciferol (VITAMIN D3) 125 MCG (5000 UT) CAPS Take 10,000 Units by mouth.    . Coenzyme Q10 (CO Q 10 PO) Take by mouth.    Marland Kitchen lisinopril (ZESTRIL) 20 MG tablet Take 1 tablet (20 mg total) by mouth daily. NEW DOSE 90 tablet 1  . Misc Natural Products (JOINT HEALTH PO) Take by mouth.    . Multiple Vitamin (MULTIVITAMIN) tablet Take 1 tablet by mouth  daily.    . Omega-3 Fatty Acids (FISH OIL PO) Take by mouth.     No facility-administered medications prior to visit.    No Known Allergies  ROS Review of Systems Negative unless specially indicated above in HPI.   Objective:    Physical Exam Vitals and nursing note reviewed.  Constitutional:      General: He is not in acute distress.    Appearance: He is obese. He is not ill-appearing, toxic-appearing or diaphoretic.  Neck:     Vascular: No carotid bruit.  Cardiovascular:     Rate and Rhythm: Normal rate and regular rhythm.     Heart sounds: Normal heart sounds. No murmur heard.   Pulmonary:     Effort: Pulmonary effort is normal. No respiratory distress.     Breath sounds: Normal breath sounds.  Abdominal:     General: Bowel sounds are normal.     Palpations: Abdomen is soft.  Musculoskeletal:     Cervical back: Neck supple. No tenderness.     Right lower leg: No swelling, deformity, tenderness or bony tenderness. No edema.     Left lower leg: No swelling, deformity, tenderness or bony tenderness. No edema.  Skin:    General: Skin is warm and dry.  Neurological:     General: No focal deficit present.     Mental Status: He is alert and oriented to person, place, and time.     Motor: No weakness.     Gait: Gait normal.  Psychiatric:        Mood and Affect: Mood normal.        Behavior: Behavior normal.     BP 136/81   Pulse 86   Temp 98.1 F (36.7 C) (Temporal)   Ht $R'6\' 1"'rY$  (1.854 m)   Wt (!) 333 lb 8 oz (151.3 kg)   BMI 44.00 kg/m  Wt Readings from Last 3 Encounters:  12/19/20 (!) 333 lb 8 oz (151.3 kg)  09/18/20 (!) 333 lb 6 oz (151.2 kg)  10/19/18 (!) 317 lb 3.2 oz (143.9 kg)     There are no preventive care reminders to display for this patient.  There are no preventive care reminders to display for this patient.  Lab Results  Component Value Date   TSH 1.200 09/18/2020   Lab Results  Component Value Date   WBC 6.2 09/18/2020   HGB  16.9  09/18/2020   HCT 50.2 09/18/2020   MCV 87 09/18/2020   PLT 279 09/18/2020   Lab Results  Component Value Date   NA 141 09/18/2020   K 4.7 09/18/2020   CO2 22 09/18/2020   GLUCOSE 109 (H) 09/18/2020   BUN 11 09/18/2020   CREATININE 0.86 09/18/2020   BILITOT 0.7 09/18/2020   ALKPHOS 73 09/18/2020   AST 39 09/18/2020   ALT 48 (H) 09/18/2020   PROT 6.6 09/18/2020   ALBUMIN 4.2 09/18/2020   CALCIUM 9.0 09/18/2020   Lab Results  Component Value Date   CHOL 217 (H) 09/18/2020   Lab Results  Component Value Date   HDL 42 09/18/2020   Lab Results  Component Value Date   LDLCALC 147 (H) 09/18/2020   Lab Results  Component Value Date   TRIG 157 (H) 09/18/2020   Lab Results  Component Value Date   CHOLHDL 5.2 (H) 09/18/2020   No results found for: HGBA1C    Assessment & Plan:   Earley was seen today for hypertension.  Diagnoses and all orders for this visit:  Primary hypertension Well controlled on current regimen. Lisinopril 20 mg. Labs pending.  -     Lipid panel -     CBC with Differential/Platelet -     CMP14+EGFR  Morbid obesity ([place]) Start phentermine as below with diet and exercise. Labs pending.  -     Lipid panel -     CBC with Differential/Platelet -     CMP14+EGFR -     phentermine (ADIPEX-P) 37.5 MG tablet; Take 1 tablet (37.5 mg total) by mouth daily before breakfast.  Gout, unspecified cause, unspecified chronicity, unspecified site Well controlled on current regimen. On allopurinol daily  Hyperlipidemia, unspecified hyperlipidemia type Diet and exercise. Labs pending. -     Lipid panel  Myalgia Continue tylenol, ibuprofen as needed. Heat, stretching, elevation, supplements. Discussed compression socks at work.  -     Arthritis Panel  Bilateral leg cramps -     tiZANidine (ZANAFLEX) 4 MG tablet; Take 1 tablet (4 mg total) by mouth every 6 (six) hours as needed for muscle spasms.  Follow-up: Return in about 1 month (around 01/19/2021) for  weight management.   The patient indicates understanding of these issues and agrees with the plan.  Gwenlyn Perking, FNP

## 2020-12-19 NOTE — Patient Instructions (Signed)
Phentermine sustained-release capsules What is this medicine? PHENTERMINE (FEN ter meen) decreases your appetite. It is used with a reduced calorie diet and exercise to help you lose weight. This medicine may be used for other purposes; ask your health care provider or pharmacist if you have questions. COMMON BRAND NAME(S): Ionamin, Pro-Fast What should I tell my health care provider before I take this medicine? They need to know if you have any of these conditions:  agitation or nervousness  diabetes  glaucoma  heart disease  high blood pressure  history of drug abuse or addiction  history of stroke  kidney disease  lung disease called Primary Pulmonary Hypertension (PPH)  taken an MAOI like Carbex, Eldepryl, Marplan, Nardil, or Parnate in last 14 days  taking stimulant medicines for attention disorders, weight loss, or to stay awake  thyroid disease  an unusual or allergic reaction to phentermine, other medicines, foods, dyes, or preservatives  pregnant or trying to get pregnant  breast-feeding How should I use this medicine? Take this medicine by mouth with a glass of water. Follow the directions on the prescription label. Do not cut, crush or chew this medicine. Swallow the capsules whole. Take your medicine at regular intervals. Do not take it more often than directed. Do not stop taking except on your doctor's advice. Talk to your pediatrician regarding the use of this medicine in children. Special care may be needed. Overdosage: If you think you have taken too much of this medicine contact a poison control center or emergency room at once. NOTE: This medicine is only for you. Do not share this medicine with others. What if I miss a dose? If you miss a dose, skip it. Take your next dose at the normal time. Do not take extra or 2 doses at the same time to make up for the missed dose. What may interact with this medicine? Do not take this medicine with any of the  following medications:  MAOIs like Carbex, Eldepryl, Marplan, Nardil, and Parnate This medicine may also interact with the following medications:  alcohol  certain medicines for depression, anxiety, or psychotic disorders  certain medicines for high blood pressure  linezolid  medicines for colds or breathing difficulties like pseudoephedrine or phenylephrine  medicines for diabetes  sibutramine  stimulant medicines for attention disorders, weight loss, or to stay awake This list may not describe all possible interactions. Give your health care provider a list of all the medicines, herbs, non-prescription drugs, or dietary supplements you use. Also tell them if you smoke, drink alcohol, or use illegal drugs. Some items may interact with your medicine. What should I watch for while using this medicine? Visit your doctor or health care provider for regular checks on your progress. Do not stop taking except on your health care provider's advice. You may develop a severe reaction. Your health care provider will tell you how much medicine to take. Do not take this medicine close to bedtime. It may prevent you from sleeping. You may get drowsy or dizzy. Do not drive, use machinery, or do anything that needs mental alertness until you know how this medicine affects you. Do not stand or sit up quickly, especially if you are an older patient. This reduces the risk of dizzy or fainting spells. Alcohol may increase dizziness and drowsiness. Avoid alcoholic drinks. This medicine may affect blood sugar levels. Ask your healthcare provider if changes in diet or medicines are needed if you have diabetes. Women should inform their health  care provider if they wish to become pregnant or think they might be pregnant. Losing weight while pregnant is not advised and may cause harm to the unborn child. Talk to your health care provider for more information. What side effects may I notice from receiving this  medicine? Side effects that you should report to your doctor or health care professional as soon as possible:  allergic reactions like skin rash, itching or hives, swelling of the face, lips, or tongue  breathing problems  changes in emotions or moods  changes in vision  chest pain or chest tightness  fast, irregular heartbeat  feeling faint or lightheaded  increased blood pressure  irritable  restlessness  tremors  seizures  signs and symptoms of a stroke like changes in vision; confusion; trouble speaking or understanding; severe headaches; sudden numbness or weakness of the face, arm or leg; trouble walking; dizziness; loss of balance or coordination  unusually weak or tired Side effects that usually do not require medical attention (report to your doctor or health care professional if they continue or are bothersome):  changes in taste  constipation or diarrhea  dizziness  dry mouth  headache  trouble sleeping  upset stomach This list may not describe all possible side effects. Call your doctor for medical advice about side effects. You may report side effects to FDA at 1-800-FDA-1088. Where should I keep my medicine? Keep out of the reach of children. This medicine can be abused. Keep your medicine in a safe place to protect it from theft. Do not share this medicine with anyone. Selling or giving away this medicine is dangerous and against the law. This medicine may cause harm and death if it is taken by other adults, children, or pets. Return medicine that has not been used to an official disposal site. Contact the DEA at (908)019-2916 or your city/county government to find a site. If you cannot return the medicine, mix any unused medicine with a substance like cat litter or coffee grounds. Then throw the medicine away in a sealed container like a sealed bag or coffee can with a lid. Do not use the medicine after the expiration date. Store at room temperature  between 20 and 25 degrees C (68 and 77 degrees F). Keep container tightly closed. NOTE: This sheet is a summary. It may not cover all possible information. If you have questions about this medicine, talk to your doctor, pharmacist, or health care provider.  2021 [employer]vier/Gold Standard (2019-06-04 12:48:37)

## 2020-12-20 ENCOUNTER — Other Ambulatory Visit: Payer: Self-pay | Admitting: Family Medicine

## 2020-12-20 LAB — LIPID PANEL
Cholesterol, Total: 213 mg/dL — ABNORMAL HIGH (ref 100–199)
LDL Chol Calc (NIH): 117 mg/dL — ABNORMAL HIGH (ref 0–99)
Triglycerides: 321 mg/dL — ABNORMAL HIGH (ref 0–149)

## 2020-12-20 LAB — ARTHRITIS PANEL
Basophils Absolute: 0.1 10*3/uL (ref 0.0–0.2)
Hematocrit: 49.8 % (ref 37.5–51.0)
Hemoglobin: 17.1 g/dL (ref 13.0–17.7)
Immature Grans (Abs): 0.1 10*3/uL (ref 0.0–0.1)
Lymphocytes Absolute: 2.3 10*3/uL (ref 0.7–3.1)
Lymphs: 33 %
MCH: 30.2 pg (ref 26.6–33.0)
MCHC: 34.3 g/dL (ref 31.5–35.7)
Monocytes Absolute: 0.5 10*3/uL (ref 0.1–0.9)
Monocytes: 7 %
Neutrophils Absolute: 3.8 10*3/uL (ref 1.4–7.0)
Neutrophils: 55 %
RBC: 5.67 x10E6/uL (ref 4.14–5.80)
RDW: 12.5 % (ref 11.6–15.4)
Sed Rate: 3 mm/hr (ref 0–15)
Uric Acid: 5.5 mg/dL (ref 3.8–8.4)
WBC: 6.9 10*3/uL (ref 3.4–10.8)

## 2020-12-20 LAB — CMP14+EGFR
ALT: 62 IU/L — ABNORMAL HIGH (ref 0–44)
AST: 42 IU/L — ABNORMAL HIGH (ref 0–40)
Albumin/Globulin Ratio: 1.6 (ref 1.2–2.2)
Albumin: 4.4 g/dL (ref 4.0–5.0)
Alkaline Phosphatase: 83 IU/L (ref 44–121)
BUN/Creatinine Ratio: 20 (ref 9–20)
BUN: 15 mg/dL (ref 6–24)
CO2: 24 mmol/L (ref 20–29)
Calcium: 9.4 mg/dL (ref 8.7–10.2)
Creatinine, Ser: 0.74 mg/dL — ABNORMAL LOW (ref 0.76–1.27)
Globulin, Total: 2.7 g/dL (ref 1.5–4.5)
Glucose: 141 mg/dL — ABNORMAL HIGH (ref 65–99)
Sodium: 133 mmol/L — ABNORMAL LOW (ref 134–144)
Total Protein: 7.1 g/dL (ref 6.0–8.5)

## 2020-12-21 LAB — HGB A1C W/O EAG: Hgb A1c MFr Bld: 6.9 % — ABNORMAL HIGH (ref 4.8–5.6)

## 2020-12-21 LAB — SPECIMEN STATUS REPORT

## 2020-12-25 ENCOUNTER — Other Ambulatory Visit: Payer: Self-pay | Admitting: Family Medicine

## 2020-12-25 DIAGNOSIS — E1165 Type 2 diabetes mellitus with hyperglycemia: Secondary | ICD-10-CM

## 2020-12-25 MED ORDER — METFORMIN HCL 500 MG PO TABS: ORAL_TABLET | ORAL | 3 refills | Status: DC

## 2020-12-27 ENCOUNTER — Encounter: Payer: Self-pay | Admitting: Family Medicine

## 2020-12-27 ENCOUNTER — Other Ambulatory Visit: Payer: Self-pay | Admitting: Family Medicine

## 2020-12-27 ENCOUNTER — Other Ambulatory Visit: Payer: Self-pay

## 2020-12-27 ENCOUNTER — Ambulatory Visit: Payer: BC Managed Care – PPO | Admitting: Family Medicine

## 2020-12-27 VITALS — BP 130/81 | HR 86 | Temp 97.9°F | Ht 73.0 in | Wt 324.0 lb

## 2020-12-27 DIAGNOSIS — E782 Mixed hyperlipidemia: Secondary | ICD-10-CM

## 2020-12-27 DIAGNOSIS — I10 Essential (primary) hypertension: Secondary | ICD-10-CM

## 2020-12-27 DIAGNOSIS — R252 Cramp and spasm: Secondary | ICD-10-CM

## 2020-12-27 DIAGNOSIS — E1165 Type 2 diabetes mellitus with hyperglycemia: Secondary | ICD-10-CM

## 2020-12-27 MED ORDER — ATORVASTATIN CALCIUM 20 MG PO TABS: 20.0000 mg | ORAL_TABLET | Freq: Every day | ORAL | 3 refills | Status: DC

## 2020-12-27 NOTE — Patient Instructions (Signed)
Diabetes Mellitus and Nutrition, Adult When you have diabetes, or diabetes mellitus, it is very important to have healthy eating habits because your blood sugar (glucose) levels are greatly affected by what you eat and drink. Eating healthy foods in the right amounts, at about the same times every day, can help you:  Control your blood glucose.  Lower your risk of heart disease.  Improve your blood pressure.  Reach or maintain a healthy weight. What can affect my meal plan? Every person with diabetes is different, and each person has different needs for a meal plan. Your health care provider may recommend that you work with a dietitian to make a meal plan that is best for you. Your meal plan may vary depending on factors such as:  The calories you need.  The medicines you take.  Your weight.  Your blood glucose, blood pressure, and cholesterol levels.  Your activity level.  Other health conditions you have, such as heart or kidney disease. How do carbohydrates affect me? Carbohydrates, also called carbs, affect your blood glucose level more than any other type of food. Eating carbs naturally raises the amount of glucose in your blood. Carb counting is a method for keeping track of how many carbs you eat. Counting carbs is important to keep your blood glucose at a healthy level, especially if you use insulin or take certain oral diabetes medicines. It is important to know how many carbs you can safely have in each meal. This is different for every person. Your dietitian can help you calculate how many carbs you should have at each meal and for each snack. How does alcohol affect me? Alcohol can cause a sudden decrease in blood glucose (hypoglycemia), especially if you use insulin or take certain oral diabetes medicines. Hypoglycemia can be a life-threatening condition. Symptoms of hypoglycemia, such as sleepiness, dizziness, and confusion, are similar to symptoms of having too much  alcohol.  Do not drink alcohol if: ? Your health care provider tells you not to drink. ? You are pregnant, may be pregnant, or are planning to become pregnant.  If you drink alcohol: ? Do not drink on an empty stomach. ? Limit how much you use to:  0-1 drink a day for women.  0-2 drinks a day for men. ? Be aware of how much alcohol is in your drink. In the [place] one drink equals one 12 oz bottle of beer (355 mL), one 5 oz glass of wine (148 mL), or one 1 oz glass of hard liquor (44 mL). ? Keep yourself hydrated with water, diet soda, or unsweetened iced tea.  Keep in mind that regular soda, juice, and other mixers may contain a lot of sugar and must be counted as carbs. What are tips for following this plan? Reading food labels  Start by checking the serving size on the "Nutrition Facts" label of packaged foods and drinks. The amount of calories, carbs, fats, and other nutrients listed on the label is based on one serving of the item. Many items contain more than one serving per package.  Check the total grams (g) of carbs in one serving. You can calculate the number of servings of carbs in one serving by dividing the total carbs by 15. For example, if a food has 30 g of total carbs per serving, it would be equal to 2 servings of carbs.  Check the number of grams (g) of saturated fats and trans fats in one serving. Choose foods that have   a low amount or none of these fats.  Check the number of milligrams (mg) of salt (sodium) in one serving. Most people should limit total sodium intake to less than 2,300 mg per day.  Always check the nutrition information of foods labeled as "low-fat" or "nonfat." These foods may be higher in added sugar or refined carbs and should be avoided.  Talk to your dietitian to identify your daily goals for nutrients listed on the label. Shopping  Avoid buying canned, pre-made, or processed foods. These foods tend to be high in fat, sodium, and added  sugar.  Shop around the outside edge of the grocery store. This is where you will most often find fresh fruits and vegetables, bulk grains, fresh meats, and fresh dairy. Cooking  Use low-heat cooking methods, such as baking, instead of high-heat cooking methods like deep frying.  Cook using healthy oils, such as olive, canola, or sunflower oil.  Avoid cooking with butter, cream, or high-fat meats. Meal planning  Eat meals and snacks regularly, preferably at the same times every day. Avoid going long periods of time without eating.  Eat foods that are high in fiber, such as fresh fruits, vegetables, beans, and whole grains. Talk with your dietitian about how many servings of carbs you can eat at each meal.  Eat 4-6 oz (112-168 g) of lean protein each day, such as lean meat, chicken, fish, eggs, or tofu. One ounce (oz) of lean protein is equal to: ? 1 oz (28 g) of meat, chicken, or fish. ? 1 egg. ?  cup (62 g) of tofu.  Eat some foods each day that contain healthy fats, such as avocado, nuts, seeds, and fish.   What foods should I eat? Fruits Berries. Apples. Oranges. Peaches. Apricots. Plums. Grapes. Mango. Papaya. Pomegranate. Kiwi. Cherries. Vegetables Lettuce. Spinach. Leafy greens, including kale, chard, collard greens, and mustard greens. Beets. Cauliflower. Cabbage. Broccoli. Carrots. Green beans. Tomatoes. Peppers. Onions. Cucumbers. Brussels sprouts. Grains Whole grains, such as whole-wheat or whole-grain bread, crackers, tortillas, cereal, and pasta. Unsweetened oatmeal. Quinoa. Brown or wild rice. Meats and other proteins Seafood. Poultry without skin. Lean cuts of poultry and beef. Tofu. Nuts. Seeds. Dairy Low-fat or fat-free dairy products such as milk, yogurt, and cheese. The items listed above may not be a complete list of foods and beverages you can eat. Contact a dietitian for more information. What foods should I avoid? Fruits Fruits canned with  syrup. Vegetables Canned vegetables. Frozen vegetables with butter or cream sauce. Grains Refined white flour and flour products such as bread, pasta, snack foods, and cereals. Avoid all processed foods. Meats and other proteins Fatty cuts of meat. Poultry with skin. Breaded or fried meats. Processed meat. Avoid saturated fats. Dairy Full-fat yogurt, cheese, or milk. Beverages Sweetened drinks, such as soda or iced tea. The items listed above may not be a complete list of foods and beverages you should avoid. Contact a dietitian for more information. Questions to ask a health care provider  Do I need to meet with a diabetes educator?  Do I need to meet with a dietitian?  What number can I call if I have questions?  When are the best times to check my blood glucose? Where to find more information:  American Diabetes Association: diabetes.org  Academy of Nutrition and Dietetics: www.eatright.org  National Institute of Diabetes and Digestive and Kidney Diseases: www.niddk.nih.gov  Association of Diabetes Care and Education Specialists: www.diabeteseducator.org Summary  It is important to have healthy eating   habits because your blood sugar (glucose) levels are greatly affected by what you eat and drink.  A healthy meal plan will help you control your blood glucose and maintain a healthy lifestyle.  Your health care provider may recommend that you work with a dietitian to make a meal plan that is best for you.  Keep in mind that carbohydrates (carbs) and alcohol have immediate effects on your blood glucose levels. It is important to count carbs and to use alcohol carefully. This information is not intended to replace advice given to you by your health care provider. Make sure you discuss any questions you have with your health care provider. Document Revised: 07/06/2019 Document Reviewed: 07/06/2019 Elsevier Patient Education  2021 Elsevier Inc.  

## 2020-12-27 NOTE — Progress Notes (Signed)
Established Patient Office Visit  Subjective:  Patient ID: Scott Weiss, male    DOB: [birth year]  Age: 45 y.o. MRN: [id number]  CC:  Chief Complaint  Patient presents with  . Diabetes    HPI Scott Weiss presents for education for new diagnosis of T2DM. He reports that he is familiar with T2DM as his father and sister have both been diagnosed. His A1c was 6.9 at his last visit. He has started on metformin and reports tolerating well without side effects. He sees a eye doctor yearly and is due for an exam. He has been eating a low carb diet and has cut out sodas. He is also been working to exercise by riding a bike and walking his dogs.    Past Medical History:  Diagnosis Date  . Diverticulitis   . Gout     Past Surgical History:  Procedure Laterality Date  . HERNIA REPAIR     groin    Family History  Problem Relation Age of Onset  . Cancer Mother   . Cirrhosis Mother   . COPD Father   . Liver cancer Sister     Social History   Socioeconomic History  . Marital status: Married    Spouse name: Not on file  . Number of children: Not on file  . Years of education: Not on file  . Highest education level: Not on file  Occupational History  . Not on file  Tobacco Use  . Smoking status: Never Smoker  . Smokeless tobacco: Never Used  Vaping Use  . Vaping Use: Never used  Substance and Sexual Activity  . Alcohol use: Never  . Drug use: Never  . Sexual activity: Yes  Other Topics Concern  . Not on file  Social History Narrative  . Not on file   Social Determinants of Health   Financial Resource Strain: Not on file  Food Insecurity: Not on file  Transportation Needs: Not on file  Physical Activity: Not on file  Stress: Not on file  Social Connections: Not on file  Intimate Partner Violence: Not on file    Outpatient Medications Prior to Visit  Medication Sig Dispense Refill  . allopurinol (ZYLOPRIM) 100 MG tablet Take 1 tablet (100 mg total) by mouth  daily. 90 tablet 3  . Cholecalciferol (VITAMIN D3) 125 MCG (5000 UT) CAPS Take 10,000 Units by mouth.    Marland Kitchen CINNAMON PO Take by mouth.    . Coenzyme Q10 (CO Q 10 PO) Take by mouth.    Marland Kitchen lisinopril (ZESTRIL) 20 MG tablet Take 1 tablet (20 mg total) by mouth daily. NEW DOSE 90 tablet 1  . metFORMIN (GLUCOPHAGE) 500 MG tablet Take 1 tablet daily with meal. After one week, increase to 1 tablet twice a day with meal. 180 tablet 3  . Misc Natural Products (JOINT HEALTH PO) Take by mouth.    . Multiple Vitamin (MULTIVITAMIN) tablet Take 1 tablet by mouth daily.    . Omega-3 Fatty Acids (FISH OIL PO) Take by mouth.    . phentermine (ADIPEX-P) 37.5 MG tablet Take 1 tablet (37.5 mg total) by mouth daily before breakfast. 30 tablet 0  . tiZANidine (ZANAFLEX) 4 MG tablet Take 1 tablet (4 mg total) by mouth every 6 (six) hours as needed for muscle spasms. 60 tablet 0   No facility-administered medications prior to visit.    No Known Allergies  ROS Review of Systems Negative unless specially indicated above in HPI.  Objective:    Physical Exam Vitals and nursing note reviewed.  Constitutional:      General: He is not in acute distress.    Appearance: Normal appearance. He is not ill-appearing, toxic-appearing or diaphoretic.  HENT:     Head: Normocephalic and atraumatic.  Musculoskeletal:     Right lower leg: No edema.     Left lower leg: No edema.  Skin:    General: Skin is warm and dry.  Neurological:     General: No focal deficit present.     Mental Status: He is alert and oriented to person, place, and time.  Psychiatric:        Mood and Affect: Mood normal.        Behavior: Behavior normal.     BP 130/81   Pulse 86   Temp 97.9 F (36.6 C) (Temporal)   Ht 6' 1" (1.854 m)   Wt (!) 324 lb (147 kg)   BMI 42.75 kg/m  Wt Readings from Last 3 Encounters:  12/27/20 (!) 324 lb (147 kg)  12/19/20 (!) 333 lb 8 oz (151.3 kg)  09/18/20 (!) 333 lb 6 oz (151.2 kg)     There are no  preventive care reminders to display for this patient.  There are no preventive care reminders to display for this patient.  Lab Results  Component Value Date   TSH 1.200 09/18/2020   Lab Results  Component Value Date   WBC 6.9 12/19/2020   HGB 17.1 12/19/2020   HCT 49.8 12/19/2020   MCV 88 12/19/2020   PLT 311 12/19/2020   Lab Results  Component Value Date   NA 133 (L) 12/19/2020   K 4.6 12/19/2020   CO2 24 12/19/2020   GLUCOSE 141 (H) 12/19/2020   BUN 15 12/19/2020   CREATININE 0.74 (L) 12/19/2020   BILITOT 0.5 12/19/2020   ALKPHOS 83 12/19/2020   AST 42 (H) 12/19/2020   ALT 62 (H) 12/19/2020   PROT 7.1 12/19/2020   ALBUMIN 4.4 12/19/2020   CALCIUM 9.4 12/19/2020   EGFR 113 12/19/2020   Lab Results  Component Value Date   CHOL 213 (H) 12/19/2020   Lab Results  Component Value Date   HDL 40 12/19/2020   Lab Results  Component Value Date   LDLCALC 117 (H) 12/19/2020   Lab Results  Component Value Date   TRIG 321 (H) 12/19/2020   Lab Results  Component Value Date   CHOLHDL 5.3 (H) 12/19/2020   Lab Results  Component Value Date   HGBA1C 6.9 (H) 12/19/2020      Assessment & Plan:   Nichols was seen today for diabetes.  Diagnoses and all orders for this visit:  Type 2 diabetes mellitus with hyperglycemia, without long-term current use of insulin (HCC) A1c 6.9. Started on metformin last week. Discussed goal of 7, foot exams, eye exam, BP and cholesterol control, diet, and exercise. Will start of atorvastatin as below.  -     atorvastatin (LIPITOR) 20 MG tablet; Take 1 tablet (20 mg total) by mouth daily.  Morbid obesity ([place]) Started on phentermine last week. Down 9 lbs from last visit. Continue diet and exercise.   Essential hypertension At goal, on lisinopril.   Mixed hyperlipidemia Start atorvastatin as below.  -     atorvastatin (LIPITOR) 20 MG tablet; Take 1 tablet (20 mg total) by mouth daily.  Follow-up: Return for keep schedule  appointment.   The patient indicates understanding of these issues and agrees with  the plan.  Gwenlyn Perking, FNP

## 2021-01-10 ENCOUNTER — Other Ambulatory Visit: Payer: Self-pay | Admitting: Family Medicine

## 2021-01-10 ENCOUNTER — Telehealth: Payer: Self-pay | Admitting: Family Medicine

## 2021-01-10 DIAGNOSIS — M109 Gout, unspecified: Secondary | ICD-10-CM

## 2021-01-10 MED ORDER — ALLOPURINOL 100 MG PO TABS: 200.0000 mg | ORAL_TABLET | Freq: Every day | ORAL | 3 refills | Status: DC

## 2021-01-10 MED ORDER — PREDNISONE 20 MG PO TABS: 20.0000 mg | ORAL_TABLET | Freq: Every day | ORAL | 0 refills | Status: AC

## 2021-01-10 NOTE — Telephone Encounter (Signed)
Pt called to let Tiffany know that ever since he started taking Metformin and Atorvastatin, his gout has been flared up, especially with his ankle and big toe.  Please call patient [phone]

## 2021-01-10 NOTE — Telephone Encounter (Signed)
Patient aware.

## 2021-01-10 NOTE — Telephone Encounter (Signed)
I sent in a prednisone burst for him to start. I also increased his allopurinol dosage to 200 mg daily.

## 2021-01-18 ENCOUNTER — Other Ambulatory Visit: Payer: Self-pay | Admitting: Family Medicine

## 2021-01-18 DIAGNOSIS — R252 Cramp and spasm: Secondary | ICD-10-CM

## 2021-01-18 NOTE — Telephone Encounter (Signed)
Last office visit 12/27/20 Last refill 12/19/20, #60, no refills

## 2021-01-22 ENCOUNTER — Other Ambulatory Visit: Payer: Self-pay

## 2021-01-22 ENCOUNTER — Ambulatory Visit: Payer: BC Managed Care – PPO | Admitting: Family Medicine

## 2021-01-22 ENCOUNTER — Encounter: Payer: Self-pay | Admitting: Family Medicine

## 2021-01-22 MED ORDER — PHENTERMINE HCL 37.5 MG PO TABS: 37.5000 mg | ORAL_TABLET | Freq: Every day | ORAL | 0 refills | Status: DC

## 2021-01-22 NOTE — Progress Notes (Signed)
Established Patient Office Visit  Subjective:  Patient ID: Scott Weiss, male    DOB: 1976-08-08  Age: 45 y.o. MRN: [id number]  CC:  Chief Complaint  Patient presents with   Obesity    HPI Scott Weiss presents for weight management. He started on phentermine 4 weeks ago. He has been doing well without side effects. He has lost 26 pounds so far. He has also cut out sweets and soda and has been increasing his fruit and vegetable intake. He has been exercising when he is able.   Past Medical History:  Diagnosis Date   Diverticulitis    Gout     Past Surgical History:  Procedure Laterality Date   HERNIA REPAIR     groin    Family History  Problem Relation Age of Onset   Cancer Mother    Cirrhosis Mother    COPD Father    Liver cancer Sister     Social History   Socioeconomic History   Marital status: Married    Spouse name: Not on file   Number of children: Not on file   Years of education: Not on file   Highest education level: Not on file  Occupational History   Not on file  Tobacco Use   Smoking status: Never   Smokeless tobacco: Never  Vaping Use   Vaping Use: Never used  Substance and Sexual Activity   Alcohol use: Never   Drug use: Never   Sexual activity: Yes  Other Topics Concern   Not on file  Social History Narrative   Not on file   Social Determinants of Health   Financial Resource Strain: Not on file  Food Insecurity: Not on file  Transportation Needs: Not on file  Physical Activity: Not on file  Stress: Not on file  Social Connections: Not on file  Intimate Partner Violence: Not on file    Outpatient Medications Prior to Visit  Medication Sig Dispense Refill   allopurinol (ZYLOPRIM) 100 MG tablet Take 2 tablets (200 mg total) by mouth daily. 90 tablet 3   atorvastatin (LIPITOR) 20 MG tablet Take 1 tablet (20 mg total) by mouth daily. 90 tablet 3   Cholecalciferol (VITAMIN D3) 125 MCG (5000 UT) CAPS Take 10,000 Units by mouth.      CINNAMON PO Take by mouth.     Coenzyme Q10 (CO Q 10 PO) Take by mouth.     lisinopril (ZESTRIL) 20 MG tablet Take 1 tablet (20 mg total) by mouth daily. NEW DOSE 90 tablet 1   metFORMIN (GLUCOPHAGE) 500 MG tablet Take 1 tablet daily with meal. After one week, increase to 1 tablet twice a day with meal. 180 tablet 3   Misc Natural Products (JOINT HEALTH PO) Take by mouth.     Multiple Vitamin (MULTIVITAMIN) tablet Take 1 tablet by mouth daily.     Omega-3 Fatty Acids (FISH OIL PO) Take by mouth.     phentermine (ADIPEX-P) 37.5 MG tablet Take 1 tablet (37.5 mg total) by mouth daily before breakfast. 30 tablet 0   tiZANidine (ZANAFLEX) 4 MG tablet TAKE 1 TABLET BY MOUTH EVERY 6 HOURS AS NEEDED FOR MUSCLE SPASMS. 60 tablet 0   No facility-administered medications prior to visit.    No Known Allergies  ROS Review of Systems As per HPI.    Objective:    Physical Exam Vitals and nursing note reviewed.  Constitutional:      General: He is not in acute distress.  Appearance: He is not ill-appearing, toxic-appearing or diaphoretic.  Cardiovascular:     Rate and Rhythm: Normal rate and regular rhythm.     Heart sounds: Normal heart sounds. No murmur heard. Pulmonary:     Effort: Pulmonary effort is normal. No respiratory distress.     Breath sounds: Normal breath sounds.  Musculoskeletal:     Right lower leg: No edema.     Left lower leg: No edema.  Skin:    General: Skin is warm and dry.  Neurological:     General: No focal deficit present.     Mental Status: He is alert and oriented to person, place, and time.  Psychiatric:        Mood and Affect: Mood normal.        Behavior: Behavior normal.    BP 119/76   Pulse 94   Temp 98.1 F (36.7 C) (Oral)   Ht $R'6\' 1"'av$  (1.854 m)   Wt (!) 307 lb 4 oz (139.4 kg)   BMI 40.54 kg/m  Wt Readings from Last 3 Encounters:  01/22/21 (!) 307 lb 4 oz (139.4 kg)  12/27/20 (!) 324 lb (147 kg)  12/19/20 (!) 333 lb 8 oz (151.3 kg)      There are no preventive care reminders to display for this patient.  There are no preventive care reminders to display for this patient.  Lab Results  Component Value Date   TSH 1.200 09/18/2020   Lab Results  Component Value Date   WBC 6.9 12/19/2020   HGB 17.1 12/19/2020   HCT 49.8 12/19/2020   MCV 88 12/19/2020   PLT 311 12/19/2020   Lab Results  Component Value Date   NA 133 (L) 12/19/2020   K 4.6 12/19/2020   CO2 24 12/19/2020   GLUCOSE 141 (H) 12/19/2020   BUN 15 12/19/2020   CREATININE 0.74 (L) 12/19/2020   BILITOT 0.5 12/19/2020   ALKPHOS 83 12/19/2020   AST 42 (H) 12/19/2020   ALT 62 (H) 12/19/2020   PROT 7.1 12/19/2020   ALBUMIN 4.4 12/19/2020   CALCIUM 9.4 12/19/2020   EGFR 113 12/19/2020   Lab Results  Component Value Date   CHOL 213 (H) 12/19/2020   Lab Results  Component Value Date   HDL 40 12/19/2020   Lab Results  Component Value Date   LDLCALC 117 (H) 12/19/2020   Lab Results  Component Value Date   TRIG 321 (H) 12/19/2020   Lab Results  Component Value Date   CHOLHDL 5.3 (H) 12/19/2020   Lab Results  Component Value Date   HGBA1C 6.9 (H) 12/19/2020      Assessment & Plan:   Scott Weiss was seen today for obesity.  Diagnoses and all orders for this visit:  Morbid obesity ([place]) Doing well on phentermine. Has last 26 pounds. Continue healthy diet and exercise.  -     phentermine (ADIPEX-P) 37.5 MG tablet; Take 1 tablet (37.5 mg total) by mouth daily before breakfast.   Follow-up: Return in about 4 weeks (around 02/19/2021) for weight management.   The patient indicates understanding of these issues and agrees with the plan.  Gwenlyn Perking, FNP

## 2021-01-22 NOTE — Patient Instructions (Signed)
Phentermine sustained-release capsules What is this medication? PHENTERMINE (FEN ter meen) decreases your appetite. It is used with a reducedcalorie diet and exercise to help you lose weight. This medicine may be used for other purposes; ask your health care provider orpharmacist if you have questions. COMMON BRAND NAME(S): Ionamin, Pro-Fast What should I tell my care team before I take this medication? They need to know if you have any of these conditions: agitation or nervousness diabetes glaucoma heart disease high blood pressure history of drug abuse or addiction history of stroke kidney disease lung disease called Primary Pulmonary Hypertension (PPH) taken an MAOI like Carbex, Eldepryl, Marplan, Nardil, or Parnate in last 14 days taking stimulant medicines for attention disorders, weight loss, or to stay awake thyroid disease an unusual or allergic reaction to phentermine, other medicines, foods, dyes, or preservatives pregnant or trying to get pregnant breast-feeding How should I use this medication? Take this medicine by mouth with a glass of water. Follow the directions on the prescription label. Do not cut, crush or chew this medicine. Swallow the capsules whole. Take your medicine at regular intervals. Do not take it moreoften than directed. Do not stop taking except on your doctor's advice. Talk to your pediatrician regarding the use of this medicine in children.Special care may be needed. Overdosage: If you think you have taken too much of this medicine contact apoison control center or emergency room at once. NOTE: This medicine is only for you. Do not share this medicine with others. What if I miss a dose? If you miss a dose, skip it. Take your next dose at the normal time. Do nottake extra or 2 doses at the same time to make up for the missed dose. What may interact with this medication? Do not take this medicine with any of the following medications: MAOIs like Carbex,  Eldepryl, Marplan, Nardil, and Parnate This medicine may also interact with the following medications: alcohol certain medicines for depression, anxiety, or psychotic disorders certain medicines for high blood pressure linezolid medicines for colds or breathing difficulties like pseudoephedrine or phenylephrine medicines for diabetes sibutramine stimulant medicines for attention disorders, weight loss, or to stay awake This list may not describe all possible interactions. Give your health care provider a list of all the medicines, herbs, non-prescription drugs, or dietary supplements you use. Also tell them if you smoke, drink alcohol, or use illegaldrugs. Some items may interact with your medicine. What should I watch for while using this medication? Visit your doctor or health care provider for regular checks on your progress. Do not stop taking except on your health care provider's advice. You may develop a severe reaction. Your health care provider will tell you how muchmedicine to take. Do not take this medicine close to bedtime. It may prevent you from sleeping. You may get drowsy or dizzy. Do not drive, use machinery, or do anything that needs mental alertness until you know how this medicine affects you. Do not stand or sit up quickly, especially if you are an older patient. This reduces the risk of dizzy or fainting spells. Alcohol may increase dizziness anddrowsiness. Avoid alcoholic drinks. This medicine may affect blood sugar levels. Ask your healthcare provider ifchanges in diet or medicines are needed if you have diabetes. Women should inform their health care provider if they wish to become pregnant or think they might be pregnant. Losing weight while pregnant is not advised and may cause harm to the unborn child. Talk to your health care provider  formore information. What side effects may I notice from receiving this medication? Side effects that you should report to your doctor or  health care professionalas soon as possible: allergic reactions like skin rash, itching or hives, swelling of the face, lips, or tongue breathing problems changes in emotions or moods changes in vision chest pain or chest tightness fast, irregular heartbeat feeling faint or lightheaded increased blood pressure irritable restlessness tremors seizures signs and symptoms of a stroke like changes in vision; confusion; trouble speaking or understanding; severe headaches; sudden numbness or weakness of the face, arm or leg; trouble walking; dizziness; loss of balance or coordination unusually weak or tired Side effects that usually do not require medical attention (report to yourdoctor or health care professional if they continue or are bothersome): changes in taste constipation or diarrhea dizziness dry mouth headache trouble sleeping upset stomach This list may not describe all possible side effects. Call your doctor for medical advice about side effects. You may report side effects to FDA at1-800-FDA-1088. Where should I keep my medication? Keep out of the reach of children. This medicine can be abused. Keep your medicine in a safe place to protect it from theft. Do not share this medicine with anyone. Selling or giving away this medicine is dangerous and against thelaw. This medicine may cause harm and death if it is taken by other adults, children, or pets. Return medicine that has not been used to an official disposal site. Contact the DEA at 682-500-3915 or your city/county government to find a site. If you cannot return the medicine, mix any unused medicine with a substance like cat litter or coffee grounds. Then throw the medicine away in a sealed container like a sealed bag or coffee can with a lid. Do not use themedicine after the expiration date. Store at room temperature between 20 and 25 degrees C (68 and 77 degrees F).Keep container tightly closed. NOTE: This sheet is a  summary. It may not cover all possible information. If you have questions about this medicine, talk to your doctor, pharmacist, orhealth care provider.  2022 Elsevier/Gold Standard (2019-06-04 12:48:37)

## 2021-01-28 ENCOUNTER — Other Ambulatory Visit: Payer: Self-pay | Admitting: Family Medicine

## 2021-01-28 DIAGNOSIS — R252 Cramp and spasm: Secondary | ICD-10-CM

## 2021-02-26 ENCOUNTER — Other Ambulatory Visit: Payer: Self-pay

## 2021-02-26 ENCOUNTER — Ambulatory Visit (INDEPENDENT_AMBULATORY_CARE_PROVIDER_SITE_OTHER): Payer: BC Managed Care – PPO | Admitting: Family Medicine

## 2021-02-26 ENCOUNTER — Encounter: Payer: Self-pay | Admitting: Family Medicine

## 2021-02-26 DIAGNOSIS — I10 Essential (primary) hypertension: Secondary | ICD-10-CM | POA: Diagnosis not present

## 2021-02-26 MED ORDER — PHENTERMINE HCL 37.5 MG PO TABS: 37.5000 mg | ORAL_TABLET | Freq: Every day | ORAL | 0 refills | Status: DC

## 2021-02-26 NOTE — Progress Notes (Addendum)
Established Patient Office Visit  Subjective:  Patient ID: Scott Weiss, male    DOB: 12-30-1975  Age: 45 y.o. MRN: [id number]  CC:  Chief Complaint  Patient presents with   4 week weight recheck    HPI Scott Weiss presents for weight management follow up. This is his 2nd month of phentermine. He has been doing well without side effects. Has lost an additional 7 lbs since his last visit. He has been improving his diet and remains active at work. He reports work has been stressful for the last few weeks.   Past Medical History:  Diagnosis Date   Diverticulitis    Gout     Past Surgical History:  Procedure Laterality Date   HERNIA REPAIR     groin    Family History  Problem Relation Age of Onset   Cancer Mother    Cirrhosis Mother    COPD Father    Liver cancer Sister     Social History   Socioeconomic History   Marital status: Married    Spouse name: Not on file   Number of children: Not on file   Years of education: Not on file   Highest education level: Not on file  Occupational History   Not on file  Tobacco Use   Smoking status: Never   Smokeless tobacco: Never  Vaping Use   Vaping Use: Never used  Substance and Sexual Activity   Alcohol use: Never   Drug use: Never   Sexual activity: Yes  Other Topics Concern   Not on file  Social History Narrative   Not on file   Social Determinants of Health   Financial Resource Strain: Not on file  Food Insecurity: Not on file  Transportation Needs: Not on file  Physical Activity: Not on file  Stress: Not on file  Social Connections: Not on file  Intimate Partner Violence: Not on file    Outpatient Medications Prior to Visit  Medication Sig Dispense Refill   allopurinol (ZYLOPRIM) 100 MG tablet Take 2 tablets (200 mg total) by mouth daily. 90 tablet 3   atorvastatin (LIPITOR) 20 MG tablet Take 1 tablet (20 mg total) by mouth daily. 90 tablet 3   Cholecalciferol (VITAMIN D3) 125 MCG (5000 UT) CAPS  Take 10,000 Units by mouth.     CINNAMON PO Take by mouth.     Coenzyme Q10 (CO Q 10 PO) Take by mouth.     lisinopril (ZESTRIL) 20 MG tablet Take 1 tablet (20 mg total) by mouth daily. NEW DOSE 90 tablet 1   metFORMIN (GLUCOPHAGE) 500 MG tablet Take 1 tablet daily with meal. After one week, increase to 1 tablet twice a day with meal. 180 tablet 3   Misc Natural Products (JOINT HEALTH PO) Take by mouth.     Multiple Vitamin (MULTIVITAMIN) tablet Take 1 tablet by mouth daily.     Omega-3 Fatty Acids (FISH OIL PO) Take by mouth.     phentermine (ADIPEX-P) 37.5 MG tablet Take 1 tablet (37.5 mg total) by mouth daily before breakfast. 30 tablet 0   tiZANidine (ZANAFLEX) 4 MG tablet TAKE 1 TABLET BY MOUTH EVERY 6 HOURS AS NEEDED FOR MUSCLE SPASMS. 60 tablet 0   No facility-administered medications prior to visit.    No Known Allergies  ROS Review of Systems  Constitutional:  Negative for fatigue and fever.  Eyes:  Negative for visual disturbance.  Respiratory:  Negative for shortness of breath and wheezing.  Cardiovascular:  Negative for chest pain, palpitations and leg swelling.  Skin:  Negative for rash.  Neurological:  Negative for dizziness, weakness and numbness.  Psychiatric/Behavioral:  Negative for agitation.      Objective:    Physical Exam Constitutional:      General: He is not in acute distress.    Appearance: He is not ill-appearing, toxic-appearing or diaphoretic.  Cardiovascular:     Rate and Rhythm: Normal rate and regular rhythm.     Heart sounds: Normal heart sounds. No murmur heard. Pulmonary:     Effort: Pulmonary effort is normal. No respiratory distress.     Breath sounds: Normal breath sounds.  Musculoskeletal:     Right lower leg: No edema.     Left lower leg: No edema.  Skin:    General: Skin is warm and dry.  Neurological:     Mental Status: He is alert.  Psychiatric:        Mood and Affect: Mood normal.        Behavior: Behavior normal.    BP  131/86   Pulse 83   Temp (!) 97 F (36.1 C) (Temporal)   Resp 20   Ht 6' 1" (1.854 m)   Wt (!) 300 lb 2 oz (136.1 kg)   SpO2 100%   BMI 39.60 kg/m  Wt Readings from Last 3 Encounters:  02/26/21 (!) 300 lb 2 oz (136.1 kg)  01/22/21 (!) 307 lb 4 oz (139.4 kg)  12/27/20 (!) 324 lb (147 kg)     There are no preventive care reminders to display for this patient.  There are no preventive care reminders to display for this patient.  Lab Results  Component Value Date   TSH 1.200 09/18/2020   Lab Results  Component Value Date   WBC 6.9 12/19/2020   HGB 17.1 12/19/2020   HCT 49.8 12/19/2020   MCV 88 12/19/2020   PLT 311 12/19/2020   Lab Results  Component Value Date   NA 133 (L) 12/19/2020   K 4.6 12/19/2020   CO2 24 12/19/2020   GLUCOSE 141 (H) 12/19/2020   BUN 15 12/19/2020   CREATININE 0.74 (L) 12/19/2020   BILITOT 0.5 12/19/2020   ALKPHOS 83 12/19/2020   AST 42 (H) 12/19/2020   ALT 62 (H) 12/19/2020   PROT 7.1 12/19/2020   ALBUMIN 4.4 12/19/2020   CALCIUM 9.4 12/19/2020   EGFR 113 12/19/2020   Lab Results  Component Value Date   CHOL 213 (H) 12/19/2020   Lab Results  Component Value Date   HDL 40 12/19/2020   Lab Results  Component Value Date   LDLCALC 117 (H) 12/19/2020   Lab Results  Component Value Date   TRIG 321 (H) 12/19/2020   Lab Results  Component Value Date   CHOLHDL 5.3 (H) 12/19/2020   Lab Results  Component Value Date   HGBA1C 6.9 (H) 12/19/2020      Assessment & Plan:   Xayvion was seen today for 4 week weight recheck.  Diagnoses and all orders for this visit:  Morbid obesity ([place]) On phentermine. Doing well without side effects. PDMP reviewed, no red flags, refill on phentermine for 1 month provided.   Essential hypertension Well controlled on current regimen.    Follow-up: Return in about 4 weeks (around 03/26/2021) for chronic follow up.   The patient indicates understanding of these issues and agrees with the  plan.  Gwenlyn Perking, FNP

## 2021-02-26 NOTE — Addendum Note (Signed)
Addended by: Gabriel Earing on: 02/26/2021 10:19 AM   Modules accepted: Orders

## 2021-03-16 ENCOUNTER — Other Ambulatory Visit: Payer: Self-pay | Admitting: Family Medicine

## 2021-03-16 DIAGNOSIS — I10 Essential (primary) hypertension: Secondary | ICD-10-CM

## 2021-03-29 ENCOUNTER — Other Ambulatory Visit: Payer: Self-pay

## 2021-03-29 ENCOUNTER — Encounter: Payer: Self-pay | Admitting: Family Medicine

## 2021-03-29 ENCOUNTER — Ambulatory Visit (INDEPENDENT_AMBULATORY_CARE_PROVIDER_SITE_OTHER): Payer: BC Managed Care – PPO | Admitting: Family Medicine

## 2021-03-29 VITALS — BP 121/75 | HR 79 | Temp 97.4°F | Ht 73.0 in | Wt 295.0 lb

## 2021-03-29 DIAGNOSIS — E1169 Type 2 diabetes mellitus with other specified complication: Secondary | ICD-10-CM | POA: Diagnosis not present

## 2021-03-29 DIAGNOSIS — E1159 Type 2 diabetes mellitus with other circulatory complications: Secondary | ICD-10-CM | POA: Diagnosis not present

## 2021-03-29 DIAGNOSIS — E1165 Type 2 diabetes mellitus with hyperglycemia: Secondary | ICD-10-CM | POA: Diagnosis not present

## 2021-03-29 DIAGNOSIS — E785 Hyperlipidemia, unspecified: Secondary | ICD-10-CM

## 2021-03-29 DIAGNOSIS — I152 Hypertension secondary to endocrine disorders: Secondary | ICD-10-CM

## 2021-03-29 LAB — BAYER DCA HB A1C WAIVED: HB A1C (BAYER DCA - WAIVED): 5.4 % (ref <7.0)

## 2021-03-29 MED ORDER — PHENTERMINE HCL 37.5 MG PO TABS: 37.5000 mg | ORAL_TABLET | Freq: Every day | ORAL | 0 refills | Status: DC

## 2021-03-29 NOTE — Progress Notes (Signed)
Established Patient Office Visit  Subjective:  Patient ID: Scott Weiss, male    DOB: Jul 23, 1976  Age: 45 y.o. MRN: [id number]  CC:  Chief Complaint  Patient presents with   Diabetes   Obesity    HPI Scott Weiss presents for diabetes and obesity management.  Obesity He has been doing well. He has lost an additional 5 pounds this month. He continues to eat a well balanced diet. He has been very busy at work. He is exercising when he is able. He is very active at work.   2. DM Patient denies foot ulcerations, increased appetite, nausea, paresthesia of the feet, polydipsia, polyuria, visual disturbances, and vomiting.  Current diabetic medications include metformin 500 mg daily Compliant with meds - Yes  Current monitoring regimen: none  Is He on ACE inhibitor or angiotensin II receptor blocker?  Yes, lisinopril 20 mg Is He on statin? Yes lipitor 20 mg  3. HTN Complaint with meds - Yes Current Medications - lisinopril 20 mg Pertinent ROS:  Headache - No Fatigue - No Visual Disturbances - No Chest pain - No Dyspnea - No Palpitations - No LE edema - No They report good compliance with medications and can restate their regimen by memory. No medication side effects.  Family, social, and smoking history reviewed.   BP Readings from Last 3 Encounters:  03/29/21 121/75  02/26/21 131/86  01/22/21 119/76   CMP Latest Ref Rng & Units 12/19/2020 09/18/2020  Glucose 65 - 99 mg/dL 141(H) 109(H)  BUN 6 - 24 mg/dL 15 11  Creatinine 0.76 - 1.27 mg/dL 0.74(L) 0.86  Sodium 134 - 144 mmol/L 133(L) 141  Potassium 3.5 - 5.2 mmol/L 4.6 4.7  Chloride 96 - 106 mmol/L 98 102  CO2 20 - 29 mmol/L 24 22  Calcium 8.7 - 10.2 mg/dL 9.4 9.0  Total Protein 6.0 - 8.5 g/dL 7.1 6.6  Total Bilirubin 0.0 - 1.2 mg/dL 0.5 0.7  Alkaline Phos 44 - 121 IU/L 83 73  AST 0 - 40 IU/L 42(H) 39  ALT 0 - 44 IU/L 62(H) 48(H)      Past Medical History:  Diagnosis Date   Diverticulitis    Gout      Past Surgical History:  Procedure Laterality Date   HERNIA REPAIR     groin    Family History  Problem Relation Age of Onset   Cancer Mother    Cirrhosis Mother    COPD Father    Liver cancer Sister     Social History   Socioeconomic History   Marital status: Married    Spouse name: Not on file   Number of children: Not on file   Years of education: Not on file   Highest education level: Not on file  Occupational History   Not on file  Tobacco Use   Smoking status: Never   Smokeless tobacco: Never  Vaping Use   Vaping Use: Never used  Substance and Sexual Activity   Alcohol use: Never   Drug use: Never   Sexual activity: Yes  Other Topics Concern   Not on file  Social History Narrative   Not on file   Social Determinants of Health   Financial Resource Strain: Not on file  Food Insecurity: Not on file  Transportation Needs: Not on file  Physical Activity: Not on file  Stress: Not on file  Social Connections: Not on file  Intimate Partner Violence: Not on file    Outpatient Medications  Prior to Visit  Medication Sig Dispense Refill   allopurinol (ZYLOPRIM) 100 MG tablet Take 2 tablets (200 mg total) by mouth daily. 90 tablet 3   atorvastatin (LIPITOR) 20 MG tablet Take 1 tablet (20 mg total) by mouth daily. 90 tablet 3   Cholecalciferol (VITAMIN D3) 125 MCG (5000 UT) CAPS Take 10,000 Units by mouth.     CINNAMON PO Take by mouth.     Coenzyme Q10 (CO Q 10 PO) Take by mouth.     lisinopril (ZESTRIL) 20 MG tablet TAKE 1 TABLET (20 MG TOTAL) BY MOUTH DAILY. NEW DOSE 90 tablet 0   metFORMIN (GLUCOPHAGE) 500 MG tablet Take 1 tablet daily with meal. After one week, increase to 1 tablet twice a day with meal. 180 tablet 3   Misc Natural Products (JOINT HEALTH PO) Take by mouth.     Multiple Vitamin (MULTIVITAMIN) tablet Take 1 tablet by mouth daily.     Omega-3 Fatty Acids (FISH OIL PO) Take by mouth.     phentermine (ADIPEX-P) 37.5 MG tablet Take 1 tablet  (37.5 mg total) by mouth daily before breakfast. 30 tablet 0   tiZANidine (ZANAFLEX) 4 MG tablet TAKE 1 TABLET BY MOUTH EVERY 6 HOURS AS NEEDED FOR MUSCLE SPASMS. 60 tablet 0   No facility-administered medications prior to visit.    No Known Allergies  ROS Review of Systems As per HPI.    Objective:    Physical Exam Vitals and nursing note reviewed.  Constitutional:      General: He is not in acute distress.    Appearance: He is not ill-appearing, toxic-appearing or diaphoretic.  Cardiovascular:     Rate and Rhythm: Normal rate and regular rhythm.     Heart sounds: Normal heart sounds. No murmur heard. Pulmonary:     Effort: Pulmonary effort is normal. No respiratory distress.     Breath sounds: Normal breath sounds.  Abdominal:     General: There is no distension.     Palpations: Abdomen is soft.  Musculoskeletal:     Right lower leg: No edema.     Left lower leg: No edema.  Skin:    General: Skin is warm and dry.  Neurological:     General: No focal deficit present.     Mental Status: He is alert and oriented to person, place, and time.  Psychiatric:        Mood and Affect: Mood normal.        Behavior: Behavior normal.    BP 121/75   Pulse 79   Temp (!) 97.4 F (36.3 C) (Temporal)   Ht 6\' 1"  (1.854 m)   Wt 295 lb (133.8 kg)   BMI 38.92 kg/m  Wt Readings from Last 3 Encounters:  03/29/21 295 lb (133.8 kg)  02/26/21 (!) 300 lb 2 oz (136.1 kg)  01/22/21 (!) 307 lb 4 oz (139.4 kg)     There are no preventive care reminders to display for this patient.  There are no preventive care reminders to display for this patient.  Lab Results  Component Value Date   TSH 1.200 09/18/2020   Lab Results  Component Value Date   WBC 6.9 12/19/2020   HGB 17.1 12/19/2020   HCT 49.8 12/19/2020   MCV 88 12/19/2020   PLT 311 12/19/2020   Lab Results  Component Value Date   NA 133 (L) 12/19/2020   K 4.6 12/19/2020   CO2 24 12/19/2020   GLUCOSE 141 (H) 12/19/2020  BUN 15 12/19/2020   CREATININE 0.74 (L) 12/19/2020   BILITOT 0.5 12/19/2020   ALKPHOS 83 12/19/2020   AST 42 (H) 12/19/2020   ALT 62 (H) 12/19/2020   PROT 7.1 12/19/2020   ALBUMIN 4.4 12/19/2020   CALCIUM 9.4 12/19/2020   EGFR 113 12/19/2020   Lab Results  Component Value Date   CHOL 213 (H) 12/19/2020   Lab Results  Component Value Date   HDL 40 12/19/2020   Lab Results  Component Value Date   LDLCALC 117 (H) 12/19/2020   Lab Results  Component Value Date   TRIG 321 (H) 12/19/2020   Lab Results  Component Value Date   CHOLHDL 5.3 (H) 12/19/2020   Lab Results  Component Value Date   HGBA1C 6.9 (H) 12/19/2020      Assessment & Plan:   Scott Weiss was seen today for diabetes and obesity.  Diagnoses and all orders for this visit:  Type 2 diabetes mellitus with hyperglycemia, without long-term current use of insulin (HCC) A1c of 5.4 today. He would like to discontinue metformin to see how his A1c is with the life style changes he has made. Agreed with plan, will recheck a1c in 3 months to reassess.  -     Bayer DCA Hb A1c Waived  Hypertension associated with diabetes ([place]) Well controlled on current regimen.   Hyperlipidemia associated with type 2 diabetes mellitus (HCC) Last LDL was 117. Continue atorvastatin. Will recheck lipid panel at next chronic follow up appointment as he is struggles significantly with lab draws.   Morbid obesity ([place]) BMI 38 with DM, HLD, and HTN. PDMP reviewed, no red flags. Has lost 35 pounds over 3 months. Refill provided for another month. Then will take a 1 month break prior to reassessing.  -     phentermine (ADIPEX-P) 37.5 MG tablet; Take 1 tablet (37.5 mg total) by mouth daily before breakfast.   Follow-up: Return in about 2 months (around 05/29/2021) for weight management.  The patient indicates understanding of these issues and agrees with the plan.    Gwenlyn Perking, FNP

## 2021-03-29 NOTE — Patient Instructions (Signed)
Diabetes Mellitus Action Plan °Following a diabetes action plan is a way for you to manage your diabetes (diabetes mellitus) symptoms. The plan is color-coded to help you understand what actions you need to take based on any symptoms you are having. °If you have symptoms in the red zone, you need medical care right away. °If you have symptoms in the yellow zone, you are having problems. °If you have symptoms in the green zone, you are doing well. °Learning about and understanding diabetes can take time. Follow the plan that you develop with your health care provider. Know the target range for your blood sugar (glucose) level, and review your treatment plan with your health care provider at each visit. °The target range for my blood sugar level is __________________________ mg/dL. °Red zone °Get medical help right away if you have any of the following symptoms: °A blood sugar test result that is below 54 mg/dL (3 mmol/L). °A blood sugar test result that is at or above 240 mg/dL (13.3 mmol/L) for 2 days in a row. °Confusion or trouble thinking clearly. °Difficulty breathing. °Sickness or a fever for 2 or more days that is not getting better. °Moderate or large ketone levels in your urine. °Feeling tired or having no energy. °If you have any red zone symptoms, do not wait to see if the symptoms will go away. Get medical help right away. Call your local emergency services (911 in the [place] Do not drive yourself to the hospital. °If you have severely low blood sugar (severe hypoglycemia) and you cannot eat or drink, you may need glucagon. Make sure a family member or close friend knows how to check your blood sugar and how to give you glucagon. You may need to be treated in a hospital for this condition. °Yellow zone °If you have any of the following symptoms, your diabetes is not under control and you may need to make some changes: °A blood sugar test result that is at or above 240 mg/dL (13.3 mmol/L) for 2 days in a  row. °Blood sugar test results that are below 70 mg/dL (3.9 mmol/L). °Other symptoms of hypoglycemia, such as: °Shaking or feeling light-headed. °Confusion or irritability. °Feeling hungry. °Having a fast heartbeat. °If you have any yellow zone symptoms: °Treat your hypoglycemia by eating or drinking 15 grams of a rapid-acting carbohydrate. Follow the 15:15 rule: °Take 15 grams of a rapid-acting carbohydrate, such as: °1 tube of glucose gel. °4 glucose pills. °4 oz (120 mL) of fruit juice. °4 oz (120 mL) of regular (not diet) soda. °Check your blood sugar 15 minutes after you take the carbohydrate. °If the repeat blood sugar test is still at or below 70 mg/dL (3.9 mmol/L), take 15 grams of a carbohydrate again. °If your blood sugar does not increase above 70 mg/dL (3.9 mmol/L) after 3 tries, get medical help right away. °After your blood sugar returns to normal, eat a meal or a snack within 1 hour. °Keep taking your daily medicines as told by your health care provider. °Check your blood sugar more often than you normally would. °Write down your results. °Call your health care provider if you have trouble keeping your blood sugar in your target range. ° °Green zone °These signs mean you are doing well and you can continue what you are doing to manage your diabetes: °Your blood sugar is within your personal target range. For most people, a blood sugar level before a meal (preprandial) should be 80-130 mg/dL (4.4-7.2 mmol/L). °You feel   well, and you are able to do daily activities. °If you are in the green zone, continue to manage your diabetes as told by your health care provider. To do this: °Eat a healthy diet. °Exercise regularly. °Check your blood sugar as told by your health care provider. °Take your medicines as told by your health care provider. ° °Where to find more information °American Diabetes Association (ADA): diabetes.org °Association of Diabetes Care & Education Specialists (ADCES):  diabeteseducator.org °Summary °Following a diabetes action plan is a way for you to manage your diabetes symptoms. The plan is color-coded to help you understand what actions you need to take based on any symptoms you are having. °Follow the plan that you develop with your health care provider. Make sure you know your personal target blood sugar level. °Review your treatment plan with your health care provider at each visit. °This information is not intended to replace advice given to you by your health care provider. Make sure you discuss any questions you have with your health care provider. °Document Revised: 02/03/2020 Document Reviewed: 02/03/2020 °Elsevier Patient Education © 2022 Elsevier Inc. ° °

## 2021-05-24 DIAGNOSIS — M9903 Segmental and somatic dysfunction of lumbar region: Secondary | ICD-10-CM | POA: Diagnosis not present

## 2021-05-24 DIAGNOSIS — M62830 Muscle spasm of back: Secondary | ICD-10-CM | POA: Diagnosis not present

## 2021-05-28 DIAGNOSIS — M9903 Segmental and somatic dysfunction of lumbar region: Secondary | ICD-10-CM | POA: Diagnosis not present

## 2021-05-28 DIAGNOSIS — M62830 Muscle spasm of back: Secondary | ICD-10-CM | POA: Diagnosis not present

## 2021-05-30 ENCOUNTER — Emergency Department (HOSPITAL_COMMUNITY)
Admission: EM | Admit: 2021-05-30 | Discharge: 2021-05-30 | Disposition: A | Discharge status: Home / Self Care | Payer: BC Managed Care – PPO | Attending: Emergency Medicine | Admitting: Emergency Medicine

## 2021-05-30 ENCOUNTER — Other Ambulatory Visit: Payer: Self-pay

## 2021-05-30 ENCOUNTER — Encounter (HOSPITAL_COMMUNITY): Payer: Self-pay | Admitting: Emergency Medicine

## 2021-05-30 ENCOUNTER — Emergency Department (HOSPITAL_COMMUNITY): Payer: BC Managed Care – PPO

## 2021-05-30 DIAGNOSIS — K5792 Diverticulitis of intestine, part unspecified, without perforation or abscess without bleeding: Secondary | ICD-10-CM | POA: Insufficient documentation

## 2021-05-30 DIAGNOSIS — M9903 Segmental and somatic dysfunction of lumbar region: Secondary | ICD-10-CM | POA: Diagnosis not present

## 2021-05-30 DIAGNOSIS — K760 Fatty (change of) liver, not elsewhere classified: Secondary | ICD-10-CM | POA: Diagnosis not present

## 2021-05-30 DIAGNOSIS — M62830 Muscle spasm of back: Secondary | ICD-10-CM | POA: Diagnosis not present

## 2021-05-30 DIAGNOSIS — R55 Syncope and collapse: Secondary | ICD-10-CM

## 2021-05-30 DIAGNOSIS — K429 Umbilical hernia without obstruction or gangrene: Secondary | ICD-10-CM | POA: Diagnosis not present

## 2021-05-30 DIAGNOSIS — D734 Cyst of spleen: Secondary | ICD-10-CM | POA: Diagnosis not present

## 2021-05-30 DIAGNOSIS — K5732 Diverticulitis of large intestine without perforation or abscess without bleeding: Secondary | ICD-10-CM | POA: Diagnosis not present

## 2021-05-30 LAB — COMPREHENSIVE METABOLIC PANEL
ALT: 28 U/L (ref 0–44)
AST: 21 U/L (ref 15–41)
Albumin: 4.2 g/dL (ref 3.5–5.0)
Alkaline Phosphatase: 61 U/L (ref 38–126)
Anion gap: 10 (ref 5–15)
BUN: 14 mg/dL (ref 6–20)
CO2: 26 mmol/L (ref 22–32)
Calcium: 9.5 mg/dL (ref 8.9–10.3)
Chloride: 100 mmol/L (ref 98–111)
Creatinine, Ser: 1.13 mg/dL (ref 0.61–1.24)
GFR, Estimated: >60 mL/min (ref >60)
Glucose, Bld: 116 mg/dL — ABNORMAL HIGH (ref 70–99)
Potassium: 4.2 mmol/L (ref 3.5–5.1)
Sodium: 136 mmol/L (ref 135–145)
Total Bilirubin: 1.7 mg/dL — ABNORMAL HIGH (ref 0.3–1.2)
Total Protein: 6.8 g/dL (ref 6.5–8.1)

## 2021-05-30 LAB — CBC WITH DIFFERENTIAL/PLATELET
Abs Immature Granulocytes: 0.09 10*3/uL — ABNORMAL HIGH (ref 0.00–0.07)
Basophils Absolute: 0.1 10*3/uL (ref 0.0–0.1)
Basophils Relative: 0 %
Eosinophils Absolute: 0 10*3/uL (ref 0.0–0.5)
Eosinophils Relative: 0 %
HCT: 47.6 % (ref 39.0–52.0)
Hemoglobin: 15.8 g/dL (ref 13.0–17.0)
Immature Granulocytes: 1 %
Lymphocytes Relative: 10 %
Lymphs Abs: 1.6 10*3/uL (ref 0.7–4.0)
MCH: 29.8 pg (ref 26.0–34.0)
MCHC: 33.2 g/dL (ref 30.0–36.0)
MCV: 89.6 fL (ref 80.0–100.0)
Monocytes Absolute: 1.1 10*3/uL — ABNORMAL HIGH (ref 0.1–1.0)
Monocytes Relative: 7 %
Neutro Abs: 12.1 10*3/uL — ABNORMAL HIGH (ref 1.7–7.7)
Neutrophils Relative %: 82 %
Platelets: 308 10*3/uL (ref 150–400)
RBC: 5.31 MIL/uL (ref 4.22–5.81)
RDW: 12.7 % (ref 11.5–15.5)
WBC: 14.9 10*3/uL — ABNORMAL HIGH (ref 4.0–10.5)
nRBC: 0 % (ref 0.0–0.2)

## 2021-05-30 LAB — TROPONIN I (HIGH SENSITIVITY)
Troponin I (High Sensitivity): 4 ng/L (ref <18)
Troponin I (High Sensitivity): 6 ng/L (ref <18)

## 2021-05-30 LAB — LIPASE, BLOOD: Lipase: 27 U/L (ref 11–51)

## 2021-05-30 MED ORDER — SODIUM CHLORIDE 0.9 % IV BOLUS
1000.0000 mL | Freq: Once | INTRAVENOUS | Status: AC
  Administered 2021-05-30: 1000 mL via INTRAVENOUS

## 2021-05-30 MED ORDER — IOHEXOL 350 MG/ML SOLN
100.0000 mL | Freq: Once | INTRAVENOUS | Status: AC | PRN
  Administered 2021-05-30: 100 mL via INTRAVENOUS

## 2021-05-30 MED ORDER — CIPROFLOXACIN HCL 500 MG PO TABS
500.0000 mg | ORAL_TABLET | Freq: Once | ORAL | Status: AC
  Administered 2021-05-30: 500 mg via ORAL
  Filled 2021-05-30: qty 1

## 2021-05-30 MED ORDER — METRONIDAZOLE 500 MG PO TABS
500.0000 mg | ORAL_TABLET | Freq: Once | ORAL | Status: AC
  Administered 2021-05-30: 500 mg via ORAL
  Filled 2021-05-30: qty 1

## 2021-05-30 MED ORDER — OXYCODONE-ACETAMINOPHEN 5-325 MG PO TABS: 0.5000 | ORAL_TABLET | Freq: Four times a day (QID) | ORAL | 0 refills | Status: DC | PRN

## 2021-05-30 MED ORDER — CIPROFLOXACIN HCL 500 MG PO TABS: 500.0000 mg | ORAL_TABLET | Freq: Two times a day (BID) | ORAL | 0 refills | Status: DC

## 2021-05-30 MED ORDER — METRONIDAZOLE 500 MG PO TABS: 500.0000 mg | ORAL_TABLET | Freq: Three times a day (TID) | ORAL | 0 refills | Status: DC

## 2021-05-30 NOTE — Discharge Instructions (Addendum)
Please read and follow all provided instructions.  Your diagnoses today include:  1. Acute diverticulitis   2. Syncope, unspecified syncope type     Tests performed today include: Complete blood cell count: Elevated white blood cell count Complete metabolic panel: Normal liver function test Lipase (pancreas function test): Normal Troponin: Blood test for heart, did not show any signs of stress on the heart EKG: No abnormal heart rhythms or other problems CT scan: Shows significant inflammation associated with diverticulitis but fortunately no signs of abscess or perforation Vital signs. See below for your results today.   Medications prescribed:  Ciprofloxacin - antibiotic  You have been prescribed an antibiotic medicine: take the entire course of medicine even if you are feeling better. Stopping early can cause the antibiotic not to work.  Metronidazole - antibiotic  You have been prescribed an antibiotic medicine: take the entire course of medicine even if you are feeling better. Stopping early can cause the antibiotic not to work. Do not drink alcohol when taking this medication.   Percocet (oxycodone/acetaminophen) - narcotic pain medication  DO NOT drive or perform any activities that require you to be awake and alert because this medicine can make you drowsy. BE VERY CAREFUL not to take multiple medicines containing Tylenol (also called acetaminophen). Doing so can lead to an overdose which can damage your liver and cause liver failure and possibly death.  Take any prescribed medications only as directed.  Home care instructions:  Follow any educational materials contained in this packet.  Follow-up instructions: Please follow-up with your primary care provider in the next 2-3 days for further evaluation of your symptoms.    Return instructions:  SEEK IMMEDIATE MEDICAL ATTENTION IF: The pain does not go away or becomes severe  A temperature above 101F develops  Repeated  vomiting occurs (multiple episodes)  The pain becomes localized to portions of the abdomen. The right side could possibly be appendicitis. In an adult, the left lower portion of the abdomen could be colitis or diverticulitis.  Blood is being passed in stools or vomit (bright red or black tarry stools)  You develop chest pain, difficulty breathing, dizziness or fainting, or become confused, poorly responsive, or inconsolable (young children) If you have any other emergent concerns regarding your health  Additional Information: Abdominal (belly) pain can be caused by many things. Your caregiver performed an examination and possibly ordered blood/urine tests and imaging (CT scan, x-rays, ultrasound). Many cases can be observed and treated at home after initial evaluation in the emergency department. Even though you are being discharged home, abdominal pain can be unpredictable. Therefore, you need a repeated exam if your pain does not resolve, returns, or worsens. Most patients with abdominal pain don't have to be admitted to the hospital or have surgery, but serious problems like appendicitis and gallbladder attacks can start out as nonspecific pain. Many abdominal conditions cannot be diagnosed in one visit, so follow-up evaluations are very important.  Your vital signs today were: BP 106/68   Pulse (!) 102   Temp 98.5 F (36.9 C) (Oral)   Resp 16   SpO2 98%  If your blood pressure (bp) was elevated above 135/85 this visit, please have this repeated by your doctor within one month. --------------

## 2021-05-30 NOTE — ED Provider Notes (Signed)
[facility] EMERGENCY DEPARTMENT Provider Note   CSN: [id number] Arrival date & time: 05/30/21  1605     History Chief Complaint  Patient presents with   Abdominal Pain    Scott Weiss is a 45 y.o. male.  Patient with history of diverticulitis, hernia surgery x2, no other abdominal surgeries --presents the emergency department for evaluation of left lower quadrant abdominal pain.  Symptoms started 2 to 3 days ago and has gradually worsened.  Pain is more severe currently than a typical episode.  About an hour prior to arrival, he felt lightheaded and dizzy at work and passed out for less than a minute.  He states that he was in a chair when this happened and was able to sit down when he felt it coming on.  He did not hit his head.  No associated chest pain or shortness of breath.  No antibiotics prior.  Onset of symptoms acute.  Nothing make symptoms better.  Pain and movement makes the pain worse.      Past Medical History:  Diagnosis Date   Diverticulitis    Gout     Patient Active Problem List   Diagnosis Date Noted   Morbid obesity (HCC) 12/19/2020   Hyperlipidemia 12/19/2020   Well adult exam 10/19/2018   Essential hypertension 12/24/2017   Gout 12/24/2017    Past Surgical History:  Procedure Laterality Date   HERNIA REPAIR     groin       Family History  Problem Relation Age of Onset   Cancer Mother    Cirrhosis Mother    COPD Father    Liver cancer Sister     Social History   Tobacco Use   Smoking status: Never   Smokeless tobacco: Never  Vaping Use   Vaping Use: Never used  Substance Use Topics   Alcohol use: Never   Drug use: Never    Home Medications Prior to Admission medications   Medication Sig Start Date End Date Taking? Authorizing Provider  allopurinol (ZYLOPRIM) 100 MG tablet Take 2 tablets (200 mg total) by mouth daily. 01/10/21   Gabriel Earing, FNP  atorvastatin (LIPITOR) 20 MG tablet Take 1 tablet (20 mg  total) by mouth daily. 12/27/20   Gabriel Earing, FNP  Cholecalciferol (VITAMIN D3) 125 MCG (5000 UT) CAPS Take 10,000 Units by mouth.    [provider]  CINNAMON PO Take by mouth.    [provider]  Coenzyme Q10 (CO Q 10 PO) Take by mouth.    [provider]  lisinopril (ZESTRIL) 20 MG tablet TAKE 1 TABLET (20 MG TOTAL) BY MOUTH DAILY. NEW DOSE 03/16/21   Gabriel Earing, FNP  metFORMIN (GLUCOPHAGE) 500 MG tablet Take 1 tablet daily with meal. After one week, increase to 1 tablet twice a day with meal. 12/25/20   Gabriel Earing, FNP  Misc Natural Products (JOINT HEALTH PO) Take by mouth.    [provider]  Multiple Vitamin (MULTIVITAMIN) tablet Take 1 tablet by mouth daily.    [provider]  Omega-3 Fatty Acids (FISH OIL PO) Take by mouth.    [provider]  phentermine (ADIPEX-P) 37.5 MG tablet Take 1 tablet (37.5 mg total) by mouth daily before breakfast. 03/29/21   Gabriel Earing, FNP  tiZANidine (ZANAFLEX) 4 MG tablet TAKE 1 TABLET BY MOUTH EVERY 6 HOURS AS NEEDED FOR MUSCLE SPASMS. 01/29/21   Gwenlyn Fudge, FNP    Allergies  Patient has no known allergies.  Review of Systems   Review of Systems  Constitutional:  Negative for fever.  HENT:  Negative for rhinorrhea and sore throat.   Eyes:  Negative for redness.  Respiratory:  Negative for cough.   Cardiovascular:  Negative for chest pain.  Gastrointestinal:  Positive for abdominal pain. Negative for diarrhea, nausea and vomiting.  Genitourinary:  Negative for dysuria and hematuria.  Musculoskeletal:  Negative for myalgias.  Skin:  Negative for rash.  Neurological:  Positive for syncope and light-headedness. Negative for headaches.   Physical Exam Updated Vital Signs BP 100/68 (BP Location: Left Arm)   Pulse (!) 118   Temp 98.5 F (36.9 C) (Oral)   Resp 16   SpO2 98%   Physical Exam Vitals and nursing note reviewed.  Constitutional:      General: He is  not in acute distress.    Appearance: He is well-developed.  HENT:     Head: Normocephalic and atraumatic.  Eyes:     General:        Right eye: No discharge.        Left eye: No discharge.     Conjunctiva/sclera: Conjunctivae normal.  Cardiovascular:     Rate and Rhythm: Normal rate and regular rhythm.     Heart sounds: Normal heart sounds.  Pulmonary:     Effort: Pulmonary effort is normal.     Breath sounds: Normal breath sounds.  Abdominal:     Palpations: Abdomen is soft.     Tenderness: There is abdominal tenderness in the suprapubic area and left lower quadrant. There is guarding. There is no rebound. Negative signs include Murphy's sign and McBurney's sign.  Musculoskeletal:     Cervical back: Normal range of motion and neck supple.  Skin:    General: Skin is warm and dry.  Neurological:     Mental Status: He is alert.    ED Results / Procedures / Treatments   Labs (all labs ordered are listed, but only abnormal results are displayed) Labs Reviewed  COMPREHENSIVE METABOLIC PANEL  CBC WITH DIFFERENTIAL/PLATELET  LIPASE, BLOOD  URINALYSIS, ROUTINE W REFLEX MICROSCOPIC  TROPONIN I (HIGH SENSITIVITY)    ED ECG REPORT   Date: 05/30/2021  Rate: 78  Rhythm: normal sinus rhythm  QRS Axis: normal  Intervals: normal  ST/T Wave abnormalities: normal  Conduction Disutrbances:none  Narrative Interpretation: No prolonged QT, signs of Brugada syndrome or WPW.  No arrhythmias or heart block.  Old EKG Reviewed: none available  I have personally reviewed the EKG tracing and agree with the computerized printout as noted.   Radiology No results found.  Procedures Procedures   Medications Ordered in ED Medications  sodium chloride 0.9 % bolus 1,000 mL (has no administration in time range)    ED Course  I have reviewed the triage vital signs and the nursing notes.  Pertinent labs & imaging results that were available during my care of the patient were reviewed by  me and considered in my medical decision making (see chart for details).  Patient seen and examined.   Exam concerning for diverticulitis.  Given severity of pain, associated syncopal episode, tachycardia will proceed with CT imaging to rule out complications.  Patient declines pain medication currently.  We will give IV fluid while awaiting labs and CT.  EKG reviewed --shows normal sinus rhythm at rate of 78.  Vital signs reviewed and are as follows: BP 100/68 (BP Location: Left Arm)   Pulse Marland Kitchen)  118   Temp 98.5 F (36.9 C) (Oral)   Resp 16   SpO2 98%   CT personally reviewed.  Consistent with acute diverticulitis.  No complications.  Patient updated.  He has tolerated first dose of Cipro and Flagyl.  Prior to discharge, blood pressures noted to be a little soft.  Orthostatics performed.  Patient denies any dizziness.  Orthostatic VS for the past 24 hrs:  BP- Lying Pulse- Lying BP- Sitting Pulse- Sitting BP- Standing at 0 minutes Pulse- Standing at 0 minutes  05/30/21 2052 112/69 98 107/73 102 109/69 103    Strongly encouraged PCP follow-up in the next 48 to 72 hours given amount of pain and inflammation noted on CT.  Patient verbalizes understanding.  He actually has an appointment with his PCP tomorrow morning.  The patient was urged to return to the Emergency Department immediately with worsening of current symptoms, worsening abdominal pain, persistent vomiting, blood noted in stools, fever, or any other concerns. The patient verbalized understanding.   Patient counseled on use of narcotic pain medications. Counseled not to combine these medications with others containing tylenol. Urged not to drink alcohol, drive, or perform any other activities that requires focus while taking these medications. The patient verbalizes understanding and agrees with the plan.     MDM Rules/Calculators/A&P                           Patient with left lower quadrant pain, history of  diverticulitis, CT scan consistent with acute diverticulitis without complication.  He is agreeable to discharge to home with oral antibiotics.  He has close PCP follow-up which I think is appropriate.    Final Clinical Impression(s) / ED Diagnoses Final diagnoses:  Acute diverticulitis  Syncope, unspecified syncope type    Rx / DC Orders ED Discharge Orders          Ordered    oxyCODONE-acetaminophen (PERCOCET/ROXICET) 5-325 MG tablet  Every 6 hours PRN,   Status:  Discontinued        05/30/21 2107    oxyCODONE-acetaminophen (PERCOCET/ROXICET) 5-325 MG tablet  Every 6 hours PRN        05/30/21 2108    ciprofloxacin (CIPRO) 500 MG tablet  2 times daily        05/30/21 2109    metroNIDAZOLE (FLAGYL) 500 MG tablet  3 times daily        05/30/21 2109             Renne Crigler, PA-C 05/30/21 2352    Ernie Avena, MD 05/31/21 0151

## 2021-05-30 NOTE — ED Notes (Signed)
Patient transported to CT 

## 2021-05-30 NOTE — ED Provider Notes (Signed)
Emergency Medicine Provider Triage Evaluation Note  Scott Weiss , a 45 y.o. male  was evaluated in triage.  Pt complains of left lower quadrant abdominal pain and syncope.  She has had pain to left lower quadrant of the last 2 to 3 days.  Pain is gone progressively worse over time.  Patient reports that he had a increase in pain earlier today, began to feel lightheaded which was followed by syncopal episode.  Patient was able to move to a seated position before syncope occurred.  No preceding chest pain or shortness of breath.  Patient endorses history of diverticulosis as well as laparoscopic hernia repair x2.  Review of Systems  Positive: Abdominal pain, syncope, lightheadedness Negative: Nausea, vomiting, blood in stool, melena, diarrhea, constipation, fever, chills, chest pain, shortness of breath  Physical Exam  BP 100/68 (BP Location: Left Arm)   Pulse (!) 118   Temp 98.5 F (36.9 C) (Oral)   Resp 16   SpO2 98%  Gen:   Awake, no distress   Resp:  Normal effort  MSK:   Moves extremities without difficulty  Other:  Abdomen soft, nondistended, tenderness to left lower quadrant, no peritoneal signs.  Medical Decision Making  Medically screening exam initiated at 4:18 PM.  Appropriate orders placed.  Scott Weiss was informed that the remainder of the evaluation will be completed by another provider, this initial triage assessment does not replace that evaluation, and the importance of remaining in the ED until their evaluation is complete.  Syncope and left lower quadrant abdominal pain.   Haskel Schroeder, PA-C 05/30/21 1619    Benjiman Core, MD 05/30/21 2209

## 2021-05-30 NOTE — ED Triage Notes (Signed)
Patient with history of diverticulosis here for complaint of LLQ abdominal pain that started a few days ago. Reports last BM this morning. Also complains of nausea, vomiting, and a near syncopal episode that occurred when the pain flare up.

## 2021-05-31 ENCOUNTER — Ambulatory Visit: Payer: BC Managed Care – PPO | Admitting: Family Medicine

## 2021-05-31 ENCOUNTER — Encounter: Payer: Self-pay | Admitting: Family Medicine

## 2021-05-31 ENCOUNTER — Other Ambulatory Visit: Payer: Self-pay

## 2021-05-31 VITALS — BP 131/80 | HR 117 | Temp 97.6°F | Ht 73.0 in | Wt 304.1 lb

## 2021-05-31 DIAGNOSIS — M545 Low back pain, unspecified: Secondary | ICD-10-CM | POA: Insufficient documentation

## 2021-05-31 DIAGNOSIS — G8929 Other chronic pain: Secondary | ICD-10-CM

## 2021-05-31 DIAGNOSIS — K5792 Diverticulitis of intestine, part unspecified, without perforation or abscess without bleeding: Secondary | ICD-10-CM | POA: Insufficient documentation

## 2021-05-31 MED ORDER — DICLOFENAC SODIUM 75 MG PO TBEC: 75.0000 mg | DELAYED_RELEASE_TABLET | Freq: Two times a day (BID) | ORAL | 1 refills | Status: DC

## 2021-05-31 NOTE — Patient Instructions (Signed)
Diverticulitis Diverticulitis is infection or inflammation of small pouches (diverticula) in the colon that form due to a condition called diverticulosis. Diverticula can trap stool (feces) and bacteria, causing infection and inflammation. Diverticulitis may cause severe stomach pain and diarrhea. It may lead to tissue damage in the colon that causes bleeding or blockage. The diverticula may also burst (rupture) and cause infected stool to enter other areas of the abdomen. What are the causes? This condition is caused by stool becoming trapped in the diverticula, which allows bacteria to grow in the diverticula. This leads to inflammation and infection. What increases the risk? You are more likely to develop this condition if you have diverticulosis. The risk increases if you: Are overweight or obese. Do not get enough exercise. Drink alcohol. Use tobacco products. Eat a diet that has a lot of red meat such as beef, pork, or lamb. Eat a diet that does not include enough fiber. High-fiber foods include fruits, vegetables, beans, nuts, and whole grains. Are over 40 years of age. What are the signs or symptoms? Symptoms of this condition may include: Pain and tenderness in the abdomen. The pain is normally located on the left side of the abdomen, but it may occur in other areas. Fever and chills. Nausea. Vomiting. Cramping. Bloating. Changes in bowel routines. Blood in your stool. How is this diagnosed? This condition is diagnosed based on: Your medical history. A physical exam. Tests to make sure there is nothing else causing your condition. These tests may include: Blood tests. Urine tests. CT scan of the abdomen. How is this treated? Most cases of this condition are mild and can be treated at home. Treatment may include: Taking over-the-counter pain medicines. Following a clear liquid diet. Taking antibiotic medicines by mouth. Resting. More severe cases may need to be treated  at a hospital. Treatment may include: Not eating or drinking. Taking prescription pain medicine. Receiving antibiotic medicines through an IV. Receiving fluids and nutrition through an IV. Surgery. When your condition is under control, your health care provider may recommend that you have a colonoscopy. This is an exam to look at the entire large intestine. During the exam, a lubricated, bendable tube is inserted into the anus and then passed into the rectum, colon, and other parts of the large intestine. A colonoscopy can show how severe your diverticula are and whether something else may be causing your symptoms. Follow these instructions at home: Medicines Take over-the-counter and prescription medicines only as told by your health care provider. These include fiber supplements, probiotics, and stool softeners. If you were prescribed an antibiotic medicine, take it as told by your health care provider. Do not stop taking the antibiotic even if you start to feel better. Ask your health care provider if the medicine prescribed to you requires you to avoid driving or using machinery. Eating and drinking  Follow a full liquid diet or another diet as directed by your health care provider. After your symptoms improve, your health care provider may tell you to change your diet. He or she may recommend that you eat a diet that contains at least 25 grams (25 g) of fiber daily. Fiber makes it easier to pass stool. Healthy sources of fiber include: Berries. One cup contains 4-8 grams of fiber. Beans or lentils. One-half cup contains 5-8 grams of fiber. Green vegetables. One cup contains 4 grams of fiber. Avoid eating red meat. General instructions Do not use any products that contain nicotine or tobacco, such as cigarettes,   e-cigarettes, and chewing tobacco. If you need help quitting, ask your health care provider. Exercise for at least 30 minutes, 3 times each week. You should exercise hard enough to  raise your heart rate and break a sweat. Keep all follow-up visits as told by your health care provider. This is important. You may need to have a colonoscopy. Contact a health care provider if: Your pain does not improve. Your bowel movements do not return to normal. Get help right away if: Your pain gets worse. Your symptoms do not get better with treatment. Your symptoms suddenly get worse. You have a fever. You vomit more than one time. You have stools that are bloody, black, or tarry. Summary Diverticulitis is infection or inflammation of small pouches (diverticula) in the colon that form due to a condition called diverticulosis. Diverticula can trap stool (feces) and bacteria, causing infection and inflammation. You are at higher risk for this condition if you have diverticulosis and you eat a diet that does not include enough fiber. Most cases of this condition are mild and can be treated at home. More severe cases may need to be treated at a hospital. When your condition is under control, your health care provider may recommend that you have an exam called a colonoscopy. This exam can show how severe your diverticula are and whether something else may be causing your symptoms. Keep all follow-up visits as told by your health care provider. This is important. This information is not intended to replace advice given to you by your health care provider. Make sure you discuss any questions you have with your health care provider. Document Revised: 05/10/2019 Document Reviewed: 05/10/2019 Elsevier Patient Education  2022 [employer].  

## 2021-05-31 NOTE — Progress Notes (Signed)
Established Patient Office Visit  Subjective:  Patient ID: Scott Weiss, male    DOB: 07/28/1976  Age: 45 y.o. MRN: [id number]  CC:  Chief Complaint  Patient presents with   Diverticulitis    HPI Scott Weiss presents for ER follow up. He went to the ER yesterday for abdominal pain last night. He had a CT that was consistent with diverticulitis. He was given a dose of flagyl and cipro in the ED last night. He was sent home with Rx for oxycodone, flagyl, and cipro. The pharmacy was closed last night when he was discharged so he has not been able to pick this up yet. He will go right after his appointment this morning. He reports that this is his 3rd flare of diverticulitis. He is having LLQ pain that is sharp and crampy. It is a 7/10. He did take ibuprofen this morning. He denies fever, diarrhea, or vomiting.   He has also been seeing a chiropractor for chronic low back pain. He reports that he has some compressed disc on xrays done at the chiropractor. He has been taking ibuprofen without much help for this. Denies saddle anesthesia or sciatica symptoms.   Today was intended to discuss restarting phentermine. He has gained 9 pounds since his last visit about 2 months ago. He was doing well on phentermine without side effects. Discussed that it is not appropriate to restart phentermine at this time given his acute diverticulitis flare. He agrees and understands.   Wt Readings from Last 3 Encounters:  05/31/21 (!) 304 lb 2 oz (138 kg)  03/29/21 295 lb (133.8 kg)  02/26/21 (!) 300 lb 2 oz (136.1 kg)    Past Medical History:  Diagnosis Date   Diverticulitis    Gout     Past Surgical History:  Procedure Laterality Date   HERNIA REPAIR     groin    Family History  Problem Relation Age of Onset   Cancer Mother    Cirrhosis Mother    COPD Father    Liver cancer Sister     Social History   Socioeconomic History   Marital status: Married    Spouse name: Not on file   Number  of children: Not on file   Years of education: Not on file   Highest education level: Not on file  Occupational History   Not on file  Tobacco Use   Smoking status: Never   Smokeless tobacco: Never  Vaping Use   Vaping Use: Never used  Substance and Sexual Activity   Alcohol use: Never   Drug use: Never   Sexual activity: Yes  Other Topics Concern   Not on file  Social History Narrative   Not on file   Social Determinants of Health   Financial Resource Strain: Not on file  Food Insecurity: Not on file  Transportation Needs: Not on file  Physical Activity: Not on file  Stress: Not on file  Social Connections: Not on file  Intimate Partner Violence: Not on file    Outpatient Medications Prior to Visit  Medication Sig Dispense Refill   allopurinol (ZYLOPRIM) 100 MG tablet Take 2 tablets (200 mg total) by mouth daily. 90 tablet 3   atorvastatin (LIPITOR) 20 MG tablet Take 1 tablet (20 mg total) by mouth daily. 90 tablet 3   Cholecalciferol (VITAMIN D3) 125 MCG (5000 UT) CAPS Take 10,000 Units by mouth.     CINNAMON PO Take by mouth.  Coenzyme Q10 (CO Q 10 PO) Take by mouth.     lisinopril (ZESTRIL) 20 MG tablet TAKE 1 TABLET (20 MG TOTAL) BY MOUTH DAILY. NEW DOSE 90 tablet 0   Misc Natural Products (JOINT HEALTH PO) Take by mouth.     Multiple Vitamin (MULTIVITAMIN) tablet Take 1 tablet by mouth daily.     Omega-3 Fatty Acids (FISH OIL PO) Take by mouth.     oxyCODONE-acetaminophen (PERCOCET/ROXICET) 5-325 MG tablet Take 0.5-1 tablets by mouth every 6 (six) hours as needed for severe pain. 5 tablet 0   tiZANidine (ZANAFLEX) 4 MG tablet TAKE 1 TABLET BY MOUTH EVERY 6 HOURS AS NEEDED FOR MUSCLE SPASMS. 60 tablet 0   ciprofloxacin (CIPRO) 500 MG tablet Take 1 tablet (500 mg total) by mouth 2 (two) times daily. (Patient not taking: Reported on 05/31/2021) 14 tablet 0   metFORMIN (GLUCOPHAGE) 500 MG tablet Take 1 tablet daily with meal. After one week, increase to 1 tablet twice  a day with meal. (Patient not taking: Reported on 05/31/2021) 180 tablet 3   metroNIDAZOLE (FLAGYL) 500 MG tablet Take 1 tablet (500 mg total) by mouth 3 (three) times daily. (Patient not taking: Reported on 05/31/2021) 21 tablet 0   phentermine (ADIPEX-P) 37.5 MG tablet Take 1 tablet (37.5 mg total) by mouth daily before breakfast. (Patient not taking: Reported on 05/31/2021) 30 tablet 0   No facility-administered medications prior to visit.    No Known Allergies  ROS Review of Systems As per HPI.    Objective:    Physical Exam Vitals and nursing note reviewed.  Constitutional:      General: He is not in acute distress.    Appearance: He is not toxic-appearing or diaphoretic.     Comments: In obvious discomfort from pain.   HENT:     Head: Normocephalic and atraumatic.  Cardiovascular:     Rate and Rhythm: Regular rhythm. Tachycardia present.     Heart sounds: Normal heart sounds. No murmur heard. Pulmonary:     Effort: Pulmonary effort is normal. No respiratory distress.     Breath sounds: Normal breath sounds.  Abdominal:     General: Bowel sounds are normal.     Palpations: Abdomen is soft.     Tenderness: There is abdominal tenderness in the left lower quadrant. There is no guarding or rebound.  Musculoskeletal:     Right lower leg: No edema.     Left lower leg: No edema.  Skin:    General: Skin is warm and dry.  Neurological:     General: No focal deficit present.     Mental Status: He is alert and oriented to person, place, and time.  Psychiatric:        Mood and Affect: Mood normal.        Behavior: Behavior normal.    BP 131/80   Pulse (!) 117   Temp 97.6 F (36.4 C) (Temporal)   Ht _0  (1.854 m)   Wt (!) 304 lb 2 oz (138 kg)   BMI 40.12 kg/m  Wt Readings from Last 3 Encounters:  05/31/21 (!) 304 lb 2 oz (138 kg)  03/29/21 295 lb (133.8 kg)  02/26/21 (!) 300 lb 2 oz (136.1 kg)     Health Maintenance Due  Topic Date Due   Pneumococcal Vaccine  5-48 Years old (1 - PCV) Never done    There are no preventive care reminders to display for this patient.  Lab Results  Component  Value Date   TSH 1.200 09/18/2020   Lab Results  Component Value Date   WBC 14.9 (H) 05/30/2021   HGB 15.8 05/30/2021   HCT 47.6 05/30/2021   MCV 89.6 05/30/2021   PLT 308 05/30/2021   Lab Results  Component Value Date   NA 136 05/30/2021   K 4.2 05/30/2021   CO2 26 05/30/2021   GLUCOSE 116 (H) 05/30/2021   BUN 14 05/30/2021   CREATININE 1.13 05/30/2021   BILITOT 1.7 (H) 05/30/2021   ALKPHOS 61 05/30/2021   AST 21 05/30/2021   ALT 28 05/30/2021   PROT 6.8 05/30/2021   ALBUMIN 4.2 05/30/2021   CALCIUM 9.5 05/30/2021   ANIONGAP 10 05/30/2021   EGFR 113 12/19/2020   Lab Results  Component Value Date   CHOL 213 (H) 12/19/2020   Lab Results  Component Value Date   HDL 40 12/19/2020   Lab Results  Component Value Date   LDLCALC 117 (H) 12/19/2020   Lab Results  Component Value Date   TRIG 321 (H) 12/19/2020   Lab Results  Component Value Date   CHOLHDL 5.3 (H) 12/19/2020   Lab Results  Component Value Date   HGBA1C 5.4 03/29/2021      Assessment & Plan:   Scott Weiss was seen today for diverticulitis.  Diagnoses and all orders for this visit:  Diverticulitis Seen in ED last night. CT consistent with diverticulitis. Has been prescribed flagyl, cipro, and oxycodone for treatment. Discussed diet and return precautions.   Chronic bilateral low back pain without sciatica Seeing chiropractor. Try voltaren as below. Do not take other NSAIDs with Voltaren.  -     diclofenac (VOLTAREN) 75 MG EC tablet; Take 1 tablet (75 mg total) by mouth 2 (two) times daily.  Morbid obesity ([place]) Planned to restart phentermine today as he previously had great results with this; however, given current diverticulitis flare, discussed not appropriate to restart this at this time. He is very busy at work, discussed that I am willing to do a televisit  with him in 2 weeks to discuss restarting phentermine if he is feeling better.   Follow-up: Return in about 2 weeks (around 06/14/2021) for televist for weight.  The patient indicates understanding of these issues and agrees with the plan.    Gwenlyn Perking, FNP

## 2021-06-06 DIAGNOSIS — M9903 Segmental and somatic dysfunction of lumbar region: Secondary | ICD-10-CM | POA: Diagnosis not present

## 2021-06-06 DIAGNOSIS — M62830 Muscle spasm of back: Secondary | ICD-10-CM | POA: Diagnosis not present

## 2021-06-07 DIAGNOSIS — M62830 Muscle spasm of back: Secondary | ICD-10-CM | POA: Diagnosis not present

## 2021-06-07 DIAGNOSIS — M9903 Segmental and somatic dysfunction of lumbar region: Secondary | ICD-10-CM | POA: Diagnosis not present

## 2021-06-11 DIAGNOSIS — M9903 Segmental and somatic dysfunction of lumbar region: Secondary | ICD-10-CM | POA: Diagnosis not present

## 2021-06-11 DIAGNOSIS — M62830 Muscle spasm of back: Secondary | ICD-10-CM | POA: Diagnosis not present

## 2021-06-12 DIAGNOSIS — M9903 Segmental and somatic dysfunction of lumbar region: Secondary | ICD-10-CM | POA: Diagnosis not present

## 2021-06-12 DIAGNOSIS — M62830 Muscle spasm of back: Secondary | ICD-10-CM | POA: Diagnosis not present

## 2021-06-14 ENCOUNTER — Other Ambulatory Visit: Payer: Self-pay | Admitting: Family Medicine

## 2021-06-14 ENCOUNTER — Encounter: Payer: Self-pay | Admitting: Family Medicine

## 2021-06-14 ENCOUNTER — Ambulatory Visit (INDEPENDENT_AMBULATORY_CARE_PROVIDER_SITE_OTHER): Payer: BC Managed Care – PPO | Admitting: Family Medicine

## 2021-06-14 DIAGNOSIS — I10 Essential (primary) hypertension: Secondary | ICD-10-CM

## 2021-06-14 DIAGNOSIS — M62830 Muscle spasm of back: Secondary | ICD-10-CM | POA: Diagnosis not present

## 2021-06-14 DIAGNOSIS — M9903 Segmental and somatic dysfunction of lumbar region: Secondary | ICD-10-CM | POA: Diagnosis not present

## 2021-06-14 MED ORDER — PHENTERMINE HCL 37.5 MG PO TABS: 37.5000 mg | ORAL_TABLET | Freq: Every day | ORAL | 0 refills | Status: DC

## 2021-06-14 NOTE — Progress Notes (Signed)
   Virtual Visit  Note Due to COVID-19 pandemic this visit was conducted virtually. This visit type was conducted due to national recommendations for restrictions regarding the COVID-19 Pandemic (e.g. social distancing, sheltering in place) in an effort to limit this patient's exposure and mitigate transmission in our community. All issues noted in this document were discussed and addressed.  A physical exam was not performed with this format.  I connected with Scott Weiss on 06/14/21 at 0800 by telephone and verified that I am speaking with the correct person using two identifiers. Scott Weiss is currently located in his car and no one is currently with him during the visit. The provider, Gabriel Earing, FNP is located in their office at time of visit.  I discussed the limitations, risks, security and privacy concerns of performing an evaluation and management service by telephone and the availability of in person appointments. I also discussed with the patient that there may be a patient responsible charge related to this service. The patient expressed understanding and agreed to proceed.  CC: weight  History and Present Illness:  HPI May reports that his diverticulitis symptoms have resolved. He would like to get started on phentermine again if possible. He did very well previously on phentermine. We discussed this at his last visit but wanted to give his diverticulitis symptoms time to resolve prior to restarting. I agreed to do a televisit for him this time as it is difficult for him to get time off of work. Discussed that follow ups with further refills will need to be in person in the office. He agrees to this plan.     ROS As per HPI.   Observations/Objective: Alert and oriented x 3. Able to speak in full sentences without difficulty. Reports last weight at home was 300 lbs.   Assessment and Plan: [name] was seen today for obesity.  Diagnoses and all orders for this  visit:  Morbid obesity ([facility]) Will restart on phentermine as he had great results last time. PDMP reviewed, no red flags. He is aware that he will need to be seen in the office for further refills. Continue diet and exercise.  -     phentermine (ADIPEX-P) 37.5 MG tablet; Take 1 tablet (37.5 mg total) by mouth daily before breakfast.    Follow Up Instructions: Return in about 4 weeks (around 07/12/2021) for weight .   I discussed the assessment and treatment plan with the patient. The patient was provided an opportunity to ask questions and all were answered. The patient agreed with the plan and demonstrated an understanding of the instructions.   The patient was advised to call back or seek an in-person evaluation if the symptoms worsen or if the condition fails to improve as anticipated.  The above assessment and management plan was discussed with the patient. The patient verbalized understanding of and has agreed to the management plan. Patient is aware to call the clinic if symptoms persist or worsen. Patient is aware when to return to the clinic for a follow-up visit. Patient educated on when it is appropriate to go to the emergency department.   Time call ended:  0811  I provided 11 minutes of  non face-to-face time during this encounter.    Gabriel Earing, FNP

## 2021-06-18 ENCOUNTER — Ambulatory Visit: Payer: BC Managed Care – PPO | Admitting: Nurse Practitioner

## 2021-06-18 ENCOUNTER — Ambulatory Visit: Payer: BC Managed Care – PPO | Admitting: Family Medicine

## 2021-06-18 ENCOUNTER — Encounter: Payer: Self-pay | Admitting: Family Medicine

## 2021-06-18 ENCOUNTER — Other Ambulatory Visit: Payer: Self-pay

## 2021-06-18 VITALS — BP 133/80 | HR 88 | Temp 98.0°F | Ht 73.0 in | Wt 300.0 lb

## 2021-06-18 DIAGNOSIS — M62830 Muscle spasm of back: Secondary | ICD-10-CM | POA: Diagnosis not present

## 2021-06-18 DIAGNOSIS — K5792 Diverticulitis of intestine, part unspecified, without perforation or abscess without bleeding: Secondary | ICD-10-CM

## 2021-06-18 DIAGNOSIS — M9903 Segmental and somatic dysfunction of lumbar region: Secondary | ICD-10-CM | POA: Diagnosis not present

## 2021-06-18 MED ORDER — METRONIDAZOLE 500 MG PO TABS: 500.0000 mg | ORAL_TABLET | Freq: Two times a day (BID) | ORAL | 0 refills | Status: DC

## 2021-06-18 MED ORDER — CIPROFLOXACIN HCL 500 MG PO TABS: 500.0000 mg | ORAL_TABLET | Freq: Two times a day (BID) | ORAL | 0 refills | Status: DC

## 2021-06-18 NOTE — Progress Notes (Signed)
Subjective:  Patient ID: Scott Weiss, male    DOB: [birth year]  Age: 45 y.o. MRN: [id number]  CC: Diverticulitis   HPI Scott Weiss presents for Onset 2 weeks ago. Got abx from E.D. Sx resolved, but now starting to return. Feels tender pain LLE for 2 days. Increased yesterday. No hematochezia. No diarrhea. Some constipation. Appetite normal.   Depression screen [facility] 2/9 06/18/2021 05/31/2021 03/29/2021  Decreased Interest 0 0 0  Down, Depressed, Hopeless 0 0 0  PHQ - 2 Score 0 0 0  Altered sleeping - 0 1  Tired, decreased energy - 0 1  Change in appetite - 0 0  Feeling bad or failure about yourself  - 0 0  Trouble concentrating - 0 0  Moving slowly or fidgety/restless - 0 0  Suicidal thoughts - 0 0  PHQ-9 Score - 0 2  Difficult doing work/chores - Not difficult at all Not difficult at all    History Scott Weiss has a past medical history of Diverticulitis and Gout.   He has a past surgical history that includes Hernia repair.   His family history includes COPD in his father; Cancer in his mother; Cirrhosis in his mother; Liver cancer in his sister.He reports that he has never smoked. He has never used smokeless tobacco. He reports that he does not drink alcohol and does not use drugs.    ROS Review of Systems  Constitutional:  Negative for fever.  Respiratory:  Negative for shortness of breath.   Cardiovascular:  Negative for chest pain.  Musculoskeletal:  Negative for arthralgias.  Skin:  Negative for rash.   Objective:  BP 133/80   Pulse 88   Temp 98 F (36.7 C)   Ht 6\' 1"  (1.854 m)   Wt 300 lb (136.1 kg)   SpO2 97%   BMI 39.58 kg/m   BP Readings from Last 3 Encounters:  06/18/21 133/80  05/31/21 131/80  05/30/21 106/68    Wt Readings from Last 3 Encounters:  06/18/21 300 lb (136.1 kg)  05/31/21 (!) 304 lb 2 oz (138 kg)  03/29/21 295 lb (133.8 kg)     Physical Exam Vitals reviewed.  Constitutional:      Appearance: He is well-developed.  HENT:      Head: Normocephalic and atraumatic.     Right Ear: External ear normal.     Left Ear: External ear normal.     Mouth/Throat:     Pharynx: No oropharyngeal exudate or posterior oropharyngeal erythema.  Eyes:     Pupils: Pupils are equal, round, and reactive to light.  Cardiovascular:     Rate and Rhythm: Normal rate and regular rhythm.     Heart sounds: No murmur heard. Pulmonary:     Effort: No respiratory distress.     Breath sounds: Normal breath sounds.  Musculoskeletal:     Cervical back: Normal range of motion and neck supple.  Neurological:     Mental Status: He is alert and oriented to person, place, and time.      Assessment & Plan:   There are no diagnoses linked to this encounter.     I have discontinued Pepper E. Schartz's oxyCODONE-acetaminophen, ciprofloxacin, and metroNIDAZOLE. I am also having him maintain his Misc Natural Products (JOINT HEALTH PO), Vitamin D3, Omega-3 Fatty Acids (FISH OIL PO), Coenzyme Q10 (CO Q 10 PO), multivitamin, metFORMIN, CINNAMON PO, atorvastatin, allopurinol, tiZANidine, diclofenac, lisinopril, and phentermine.  Allergies as of 06/18/2021   No Known Allergies  Medication List        Accurate as of June 18, 2021  8:42 AM. If you have any questions, ask your nurse or doctor.          STOP taking these medications    ciprofloxacin 500 MG tablet Commonly known as: CIPRO Stopped by: Mechele Claude, MD   metroNIDAZOLE 500 MG tablet Commonly known as: FLAGYL Stopped by: Mechele Claude, MD   oxyCODONE-acetaminophen 5-325 MG tablet Commonly known as: PERCOCET/ROXICET Stopped by: Mechele Claude, MD       TAKE these medications    allopurinol 100 MG tablet Commonly known as: ZYLOPRIM Take 2 tablets (200 mg total) by mouth daily.   atorvastatin 20 MG tablet Commonly known as: LIPITOR Take 1 tablet (20 mg total) by mouth daily.   CINNAMON PO Take by mouth.   CO Q 10 PO Take by mouth.   diclofenac 75 MG EC  tablet Commonly known as: VOLTAREN Take 1 tablet (75 mg total) by mouth 2 (two) times daily.   FISH OIL PO Take by mouth.   JOINT HEALTH PO Take by mouth.   lisinopril 20 MG tablet Commonly known as: ZESTRIL TAKE 1 TABLET (20 MG TOTAL) BY MOUTH DAILY. NEW DOSE   metFORMIN 500 MG tablet Commonly known as: GLUCOPHAGE Take 1 tablet daily with meal. After one week, increase to 1 tablet twice a day with meal.   multivitamin tablet Take 1 tablet by mouth daily.   phentermine 37.5 MG tablet Commonly known as: Adipex-P Take 1 tablet (37.5 mg total) by mouth daily before breakfast.   tiZANidine 4 MG tablet Commonly known as: ZANAFLEX TAKE 1 TABLET BY MOUTH EVERY 6 HOURS AS NEEDED FOR MUSCLE SPASMS.   Vitamin D3 125 MCG (5000 UT) Caps Take 10,000 Units by mouth.         Follow-up: No follow-ups on file.  Mechele Claude, M.D.

## 2021-06-21 DIAGNOSIS — M9903 Segmental and somatic dysfunction of lumbar region: Secondary | ICD-10-CM | POA: Diagnosis not present

## 2021-06-21 DIAGNOSIS — M62830 Muscle spasm of back: Secondary | ICD-10-CM | POA: Diagnosis not present

## 2021-06-25 DIAGNOSIS — M62830 Muscle spasm of back: Secondary | ICD-10-CM | POA: Diagnosis not present

## 2021-06-25 DIAGNOSIS — M9903 Segmental and somatic dysfunction of lumbar region: Secondary | ICD-10-CM | POA: Diagnosis not present

## 2021-06-27 DIAGNOSIS — M62830 Muscle spasm of back: Secondary | ICD-10-CM | POA: Diagnosis not present

## 2021-06-27 DIAGNOSIS — M9903 Segmental and somatic dysfunction of lumbar region: Secondary | ICD-10-CM | POA: Diagnosis not present

## 2021-06-28 DIAGNOSIS — M62830 Muscle spasm of back: Secondary | ICD-10-CM | POA: Diagnosis not present

## 2021-06-28 DIAGNOSIS — M9903 Segmental and somatic dysfunction of lumbar region: Secondary | ICD-10-CM | POA: Diagnosis not present

## 2021-07-02 DIAGNOSIS — M62830 Muscle spasm of back: Secondary | ICD-10-CM | POA: Diagnosis not present

## 2021-07-02 DIAGNOSIS — M9903 Segmental and somatic dysfunction of lumbar region: Secondary | ICD-10-CM | POA: Diagnosis not present

## 2021-07-04 DIAGNOSIS — M62830 Muscle spasm of back: Secondary | ICD-10-CM | POA: Diagnosis not present

## 2021-07-04 DIAGNOSIS — M9903 Segmental and somatic dysfunction of lumbar region: Secondary | ICD-10-CM | POA: Diagnosis not present

## 2021-07-09 DIAGNOSIS — M62830 Muscle spasm of back: Secondary | ICD-10-CM | POA: Diagnosis not present

## 2021-07-09 DIAGNOSIS — M9903 Segmental and somatic dysfunction of lumbar region: Secondary | ICD-10-CM | POA: Diagnosis not present

## 2021-07-12 ENCOUNTER — Encounter: Payer: Self-pay | Admitting: Family Medicine

## 2021-07-12 ENCOUNTER — Ambulatory Visit (INDEPENDENT_AMBULATORY_CARE_PROVIDER_SITE_OTHER): Payer: BC Managed Care – PPO | Admitting: Family Medicine

## 2021-07-12 ENCOUNTER — Other Ambulatory Visit: Payer: Self-pay

## 2021-07-12 DIAGNOSIS — G8929 Other chronic pain: Secondary | ICD-10-CM

## 2021-07-12 DIAGNOSIS — M62830 Muscle spasm of back: Secondary | ICD-10-CM | POA: Diagnosis not present

## 2021-07-12 DIAGNOSIS — M9903 Segmental and somatic dysfunction of lumbar region: Secondary | ICD-10-CM | POA: Diagnosis not present

## 2021-07-12 DIAGNOSIS — I10 Essential (primary) hypertension: Secondary | ICD-10-CM

## 2021-07-12 DIAGNOSIS — M545 Low back pain, unspecified: Secondary | ICD-10-CM

## 2021-07-12 MED ORDER — METHOCARBAMOL 500 MG PO TABS: 500.0000 mg | ORAL_TABLET | Freq: Four times a day (QID) | ORAL | 1 refills | Status: DC

## 2021-07-12 MED ORDER — PHENTERMINE HCL 37.5 MG PO TABS: 37.5000 mg | ORAL_TABLET | Freq: Every day | ORAL | 0 refills | Status: DC

## 2021-07-12 MED ORDER — LISINOPRIL 20 MG PO TABS: 20.0000 mg | ORAL_TABLET | Freq: Every day | ORAL | 3 refills | Status: DC

## 2021-07-12 NOTE — Progress Notes (Signed)
Established Patient Office Visit  Subjective:  Patient ID: Scott Weiss, male    DOB: [birth year]  Age: 45 y.o. MRN: [id number]  CC:  Chief Complaint  Patient presents with   Obesity    HPI Scott Weiss presents for weight management follow up.   He restarted on phentermine about 4 weeks ago. He reports doing well on this without side effects. He has maintained his weight since his last visit. He does admit that he splurged on his diet for the holiday. He continues to be active and has started back on a well balanced diet.   He has been seeing a chiropractor for chronic back pain. This has been helpful. He does sometimes have muscle spasms. He has tried tizanidine but this seemed to make his spasms worse. He denies saddle anesthesia, numbness, or tingling.   He denies chest pain, shortness of breath, edema, dyspnea, dizziness, or palpitations.    Wt Readings from Last 3 Encounters:  07/12/21 300 lb (136.1 kg)  06/18/21 300 lb (136.1 kg)  05/31/21 (!) 304 lb 2 oz (138 kg)     Past Medical History:  Diagnosis Date   Diverticulitis    Gout     Past Surgical History:  Procedure Laterality Date   HERNIA REPAIR     groin    Family History  Problem Relation Age of Onset   Cancer Mother    Cirrhosis Mother    COPD Father    Liver cancer Sister     Social History   Socioeconomic History   Marital status: Married    Spouse name: Not on file   Number of children: Not on file   Years of education: Not on file   Highest education level: Not on file  Occupational History   Not on file  Tobacco Use   Smoking status: Never   Smokeless tobacco: Never  Vaping Use   Vaping Use: Never used  Substance and Sexual Activity   Alcohol use: Never   Drug use: Never   Sexual activity: Yes  Other Topics Concern   Not on file  Social History Narrative   Not on file   Social Determinants of Health   Financial Resource Strain: Not on file  Food Insecurity: Not on file   Transportation Needs: Not on file  Physical Activity: Not on file  Stress: Not on file  Social Connections: Not on file  Intimate Partner Violence: Not on file    Outpatient Medications Prior to Visit  Medication Sig Dispense Refill   allopurinol (ZYLOPRIM) 100 MG tablet Take 2 tablets (200 mg total) by mouth daily. 90 tablet 3   atorvastatin (LIPITOR) 20 MG tablet Take 1 tablet (20 mg total) by mouth daily. 90 tablet 3   Cholecalciferol (VITAMIN D3) 125 MCG (5000 UT) CAPS Take 10,000 Units by mouth.     CINNAMON PO Take by mouth.     Coenzyme Q10 (CO Q 10 PO) Take by mouth.     [employer] FIBER GUMMIES PO Take by mouth.     diclofenac (VOLTAREN) 75 MG EC tablet Take 1 tablet (75 mg total) by mouth 2 (two) times daily. 60 tablet 1   lisinopril (ZESTRIL) 20 MG tablet TAKE 1 TABLET (20 MG TOTAL) BY MOUTH DAILY. NEW DOSE 90 tablet 0   Misc Natural Products (JOINT HEALTH PO) Take by mouth.     Multiple Vitamin (MULTIVITAMIN) tablet Take 1 tablet by mouth daily.     Omega-3 Fatty Acids (  FISH OIL PO) Take by mouth.     phentermine (ADIPEX-P) 37.5 MG tablet Take 1 tablet (37.5 mg total) by mouth daily before breakfast. 30 tablet 0   tiZANidine (ZANAFLEX) 4 MG tablet TAKE 1 TABLET BY MOUTH EVERY 6 HOURS AS NEEDED FOR MUSCLE SPASMS. 60 tablet 0   ciprofloxacin (CIPRO) 500 MG tablet Take 1 tablet (500 mg total) by mouth 2 (two) times daily. 20 tablet 0   metFORMIN (GLUCOPHAGE) 500 MG tablet Take 1 tablet daily with meal. After one week, increase to 1 tablet twice a day with meal. (Patient not taking: Reported on 07/12/2021) 180 tablet 3   metroNIDAZOLE (FLAGYL) 500 MG tablet Take 1 tablet (500 mg total) by mouth 2 (two) times daily. 20 tablet 0   No facility-administered medications prior to visit.    No Known Allergies  ROS Review of Systems As per HPI.   Objective:    Physical Exam Vitals and nursing note reviewed.  Constitutional:      General: He is not in acute distress.    Appearance:  He is obese. He is not ill-appearing, toxic-appearing or diaphoretic.  Cardiovascular:     Rate and Rhythm: Normal rate and regular rhythm.     Heart sounds: Normal heart sounds. No murmur heard. Pulmonary:     Effort: Pulmonary effort is normal. No respiratory distress.     Breath sounds: Normal breath sounds.  Abdominal:     General: Bowel sounds are normal. There is no distension.     Palpations: Abdomen is soft.     Tenderness: There is no abdominal tenderness. There is no guarding or rebound.  Musculoskeletal:     Right lower leg: No edema.     Left lower leg: No edema.  Skin:    General: Skin is warm and dry.  Neurological:     General: No focal deficit present.     Mental Status: He is alert and oriented to person, place, and time.  Psychiatric:        Mood and Affect: Mood normal.        Behavior: Behavior normal.    BP 127/85   Pulse 77   Temp 97.6 F (36.4 C) (Temporal)   Ht $R'6\' 1"'Zd$  (1.854 m)   Wt 300 lb (136.1 kg)   BMI 39.58 kg/m  Wt Readings from Last 3 Encounters:  07/12/21 300 lb (136.1 kg)  06/18/21 300 lb (136.1 kg)  05/31/21 (!) 304 lb 2 oz (138 kg)     There are no preventive care reminders to display for this patient.  There are no preventive care reminders to display for this patient.  Lab Results  Component Value Date   TSH 1.200 09/18/2020   Lab Results  Component Value Date   WBC 14.9 (H) 05/30/2021   HGB 15.8 05/30/2021   HCT 47.6 05/30/2021   MCV 89.6 05/30/2021   PLT 308 05/30/2021   Lab Results  Component Value Date   NA 136 05/30/2021   K 4.2 05/30/2021   CO2 26 05/30/2021   GLUCOSE 116 (H) 05/30/2021   BUN 14 05/30/2021   CREATININE 1.13 05/30/2021   BILITOT 1.7 (H) 05/30/2021   ALKPHOS 61 05/30/2021   AST 21 05/30/2021   ALT 28 05/30/2021   PROT 6.8 05/30/2021   ALBUMIN 4.2 05/30/2021   CALCIUM 9.5 05/30/2021   ANIONGAP 10 05/30/2021   EGFR 113 12/19/2020   Lab Results  Component Value Date   CHOL 213 (H)  12/19/2020  Lab Results  Component Value Date   HDL 40 12/19/2020   Lab Results  Component Value Date   LDLCALC 117 (H) 12/19/2020   Lab Results  Component Value Date   TRIG 321 (H) 12/19/2020   Lab Results  Component Value Date   CHOLHDL 5.3 (H) 12/19/2020   Lab Results  Component Value Date   HGBA1C 5.4 03/29/2021      Assessment & Plan:   Scott Weiss was seen today for obesity.  Diagnoses and all orders for this visit:  Morbid obesity ([place]) Maintained weight since last visit. PDMP reviewed, no red flags. Refill provided. Diet and exercise.  -     phentermine (ADIPEX-P) 37.5 MG tablet; Take 1 tablet (37.5 mg total) by mouth daily before breakfast.  Primary hypertension Well controlled on current regimen. Continue lisinopril.  -     lisinopril (ZESTRIL) 20 MG tablet; Take 1 tablet (20 mg total) by mouth daily. NEW DOSE  Chronic bilateral low back pain without sciatica Seeing chiropractor. Will try robaxin as below. Heat, ice, stretching.  -     methocarbamol (ROBAXIN) 500 MG tablet; Take 1 tablet (500 mg total) by mouth 4 (four) times daily.  Follow-up: Return in about 4 weeks (around 08/09/2021) for weight management.  The patient indicates understanding of these issues and agrees with the plan.     Gwenlyn Perking, FNP

## 2021-07-18 DIAGNOSIS — M62830 Muscle spasm of back: Secondary | ICD-10-CM | POA: Diagnosis not present

## 2021-07-18 DIAGNOSIS — M9903 Segmental and somatic dysfunction of lumbar region: Secondary | ICD-10-CM | POA: Diagnosis not present

## 2021-07-26 DIAGNOSIS — M9903 Segmental and somatic dysfunction of lumbar region: Secondary | ICD-10-CM | POA: Diagnosis not present

## 2021-07-26 DIAGNOSIS — M62830 Muscle spasm of back: Secondary | ICD-10-CM | POA: Diagnosis not present

## 2021-08-01 DIAGNOSIS — M62830 Muscle spasm of back: Secondary | ICD-10-CM | POA: Diagnosis not present

## 2021-08-01 DIAGNOSIS — M9903 Segmental and somatic dysfunction of lumbar region: Secondary | ICD-10-CM | POA: Diagnosis not present

## 2021-08-08 DIAGNOSIS — M9903 Segmental and somatic dysfunction of lumbar region: Secondary | ICD-10-CM | POA: Diagnosis not present

## 2021-08-08 DIAGNOSIS — M62830 Muscle spasm of back: Secondary | ICD-10-CM | POA: Diagnosis not present

## 2021-08-15 ENCOUNTER — Ambulatory Visit: Payer: BC Managed Care – PPO | Admitting: Family Medicine

## 2021-08-15 ENCOUNTER — Encounter: Payer: Self-pay | Admitting: Family Medicine

## 2021-08-15 VITALS — BP 133/80 | HR 72 | Temp 97.9°F | Ht 73.0 in | Wt 309.6 lb

## 2021-08-15 DIAGNOSIS — I10 Essential (primary) hypertension: Secondary | ICD-10-CM | POA: Diagnosis not present

## 2021-08-15 MED ORDER — PHENTERMINE HCL 37.5 MG PO TABS: 37.5000 mg | ORAL_TABLET | Freq: Every day | ORAL | 0 refills | Status: DC

## 2021-08-15 NOTE — Progress Notes (Signed)
Of med for a month. Here to renew  Subjective:  Patient ID: Scott Weiss, male    DOB: 01-10-1976  Age: 46 y.o. MRN: [id number]  CC: Weight Check   HPI Scott Weiss presents for off adipex for a month. Weight rebounding over the holidays. Net loss since starting the med last may is 33 lbs.  Depression screen [facility] 2/9 08/15/2021 07/12/2021 06/18/2021  Decreased Interest 0 0 0  Down, Depressed, Hopeless 0 0 0  PHQ - 2 Score 0 0 0  Altered sleeping - 0 -  Tired, decreased energy - 0 -  Change in appetite - 0 -  Feeling bad or failure about yourself  - 0 -  Trouble concentrating - 0 -  Moving slowly or fidgety/restless - 0 -  Suicidal thoughts - 0 -  PHQ-9 Score - 0 -  Difficult doing work/chores - Not difficult at all -    History Scott Weiss has a past medical history of Diverticulitis and Gout.   Scott Weiss has a past surgical history that includes Hernia repair.   His family history includes COPD in his father; Cancer in his mother; Cirrhosis in his mother; Liver cancer in his sister.Scott Weiss reports that Scott Weiss has never smoked. Scott Weiss has never used smokeless tobacco. Scott Weiss reports that Scott Weiss does not drink alcohol and does not use drugs.    ROS Review of Systems  Constitutional:  Negative for fever.  Respiratory:  Negative for shortness of breath.   Cardiovascular:  Negative for chest pain.  Musculoskeletal:  Negative for arthralgias.  Skin:  Negative for rash.   Objective:  BP 133/80    Pulse 72    Temp 97.9 F (36.6 C) (Temporal)    Ht 6\' 1"  (1.854 m)    Wt (!) 309 lb 9.6 oz (140.4 kg)    BMI 40.85 kg/m   BP Readings from Last 3 Encounters:  08/15/21 133/80  07/12/21 127/85  06/18/21 133/80    Wt Readings from Last 3 Encounters:  08/15/21 (!) 309 lb 9.6 oz (140.4 kg)  07/12/21 300 lb (136.1 kg)  06/18/21 300 lb (136.1 kg)     Physical Exam Vitals reviewed.  Constitutional:      Appearance: Scott Weiss is well-developed.  HENT:     Head: Normocephalic and atraumatic.     Right Ear: External  ear normal.     Left Ear: External ear normal.     Mouth/Throat:     Pharynx: No oropharyngeal exudate or posterior oropharyngeal erythema.  Eyes:     Pupils: Pupils are equal, round, and reactive to light.  Cardiovascular:     Rate and Rhythm: Normal rate and regular rhythm.     Heart sounds: No murmur heard. Pulmonary:     Effort: No respiratory distress.     Breath sounds: Normal breath sounds.  Musculoskeletal:     Cervical back: Normal range of motion and neck supple.  Neurological:     Mental Status: Scott Weiss is alert and oriented to person, place, and time.      Assessment & Plan:   Scott Weiss was seen today for weight check.  Diagnoses and all orders for this visit:  Primary hypertension  Morbid obesity (HCC) -     phentermine (ADIPEX-P) 37.5 MG tablet; Take 1 tablet (37.5 mg total) by mouth daily before breakfast.       I am having Scott Weiss maintain his Misc Natural Products (JOINT HEALTH PO), Vitamin D3, Omega-3 Fatty Acids (FISH OIL PO), Coenzyme  Q10 (CO Q 10 PO), multivitamin, CINNAMON PO, atorvastatin, allopurinol, diclofenac, CVS FIBER GUMMIES PO, lisinopril, methocarbamol, and phentermine.  Allergies as of 08/15/2021   No Known Allergies      Medication List        Accurate as of August 15, 2021  7:52 PM. If you have any questions, ask your nurse or doctor.          allopurinol 100 MG tablet Commonly known as: ZYLOPRIM Take 2 tablets (200 mg total) by mouth daily.   atorvastatin 20 MG tablet Commonly known as: LIPITOR Take 1 tablet (20 mg total) by mouth daily.   CINNAMON PO Take by mouth.   CO Q 10 PO Take by mouth.   [employer] FIBER GUMMIES PO Take by mouth.   diclofenac 75 MG EC tablet Commonly known as: VOLTAREN Take 1 tablet (75 mg total) by mouth 2 (two) times daily.   FISH OIL PO Take by mouth.   JOINT HEALTH PO Take by mouth.   lisinopril 20 MG tablet Commonly known as: ZESTRIL Take 1 tablet (20 mg total) by mouth daily. NEW  DOSE   methocarbamol 500 MG tablet Commonly known as: Robaxin Take 1 tablet (500 mg total) by mouth 4 (four) times daily.   multivitamin tablet Take 1 tablet by mouth daily.   phentermine 37.5 MG tablet Commonly known as: Adipex-P Take 1 tablet (37.5 mg total) by mouth daily before breakfast.   Vitamin D3 125 MCG (5000 UT) Caps Take 10,000 Units by mouth.         Follow-up: No follow-ups on file.  Mechele Claude, M.D.

## 2021-08-22 DIAGNOSIS — M9903 Segmental and somatic dysfunction of lumbar region: Secondary | ICD-10-CM | POA: Diagnosis not present

## 2021-08-22 DIAGNOSIS — M62830 Muscle spasm of back: Secondary | ICD-10-CM | POA: Diagnosis not present

## 2021-09-06 DIAGNOSIS — M62830 Muscle spasm of back: Secondary | ICD-10-CM | POA: Diagnosis not present

## 2021-09-06 DIAGNOSIS — M9903 Segmental and somatic dysfunction of lumbar region: Secondary | ICD-10-CM | POA: Diagnosis not present

## 2021-09-18 ENCOUNTER — Ambulatory Visit: Payer: BC Managed Care – PPO | Admitting: Family Medicine

## 2021-10-15 ENCOUNTER — Other Ambulatory Visit: Payer: Self-pay | Admitting: Family Medicine

## 2021-10-15 DIAGNOSIS — G8929 Other chronic pain: Secondary | ICD-10-CM

## 2021-10-15 DIAGNOSIS — M545 Low back pain, unspecified: Secondary | ICD-10-CM

## 2022-08-27 IMAGING — CT CT ABD-PELV W/ CM
2 of 5 series · 16 of 46 positions shown, 18 images · IV contrast (APPLIED)
Comparison: 05/29/2006

CLINICAL DATA: Left lower quadrant pain, history of diverticulitis.

EXAM:
CT ABDOMEN AND PELVIS WITH CONTRAST
TECHNIQUE: Multidetector CT imaging of the abdomen and pelvis was performed
using the standard protocol following bolus administration of
intravenous contrast.
CONTRAST:  100mL OMNIPAQUE IOHEXOL 350 MG/ML SOLN

[Series 3: abdomen 5.0 · axial · 0.98mm/px · z∈[-548,-108]mm · 13 of 104 slices shown, 15 images]
[im 8/104  soft-tissue]
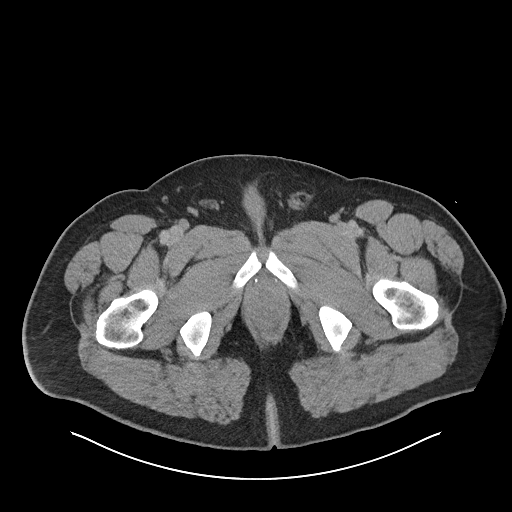
[im 8/104  bone]
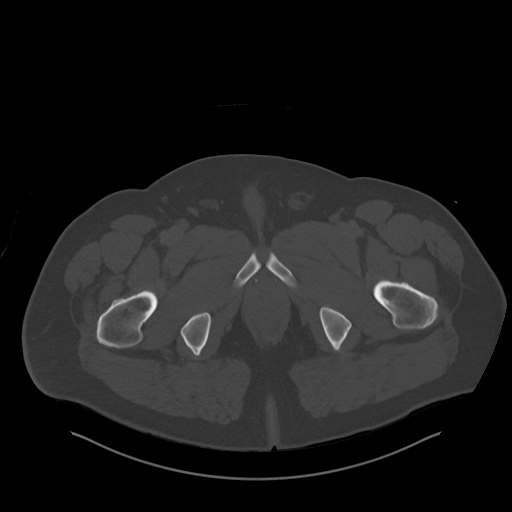
[im 15/104  soft-tissue]
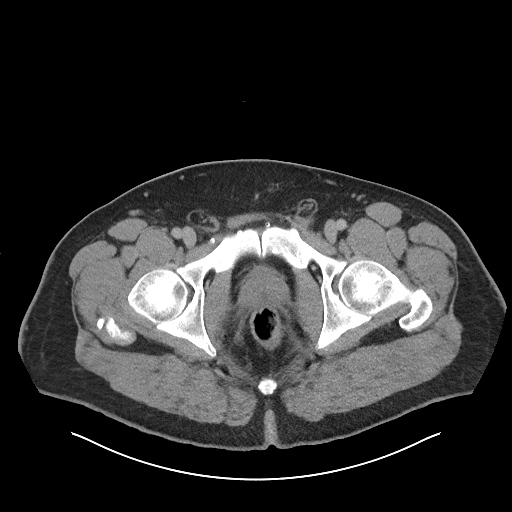
[im 23/104  soft-tissue]
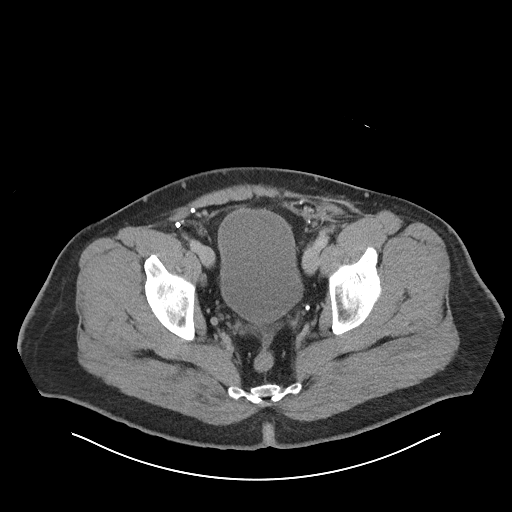
[im 30/104  soft-tissue]
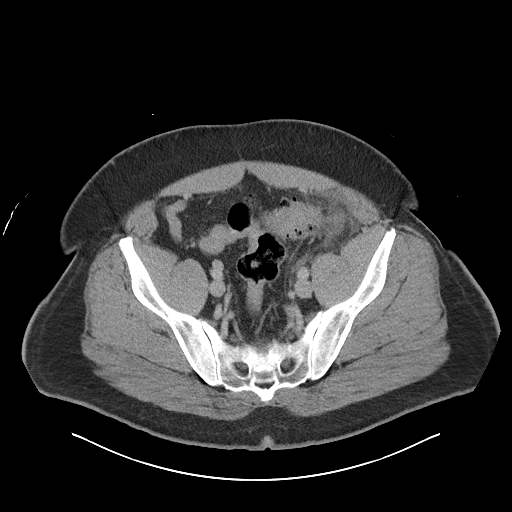
[im 37/104  soft-tissue]
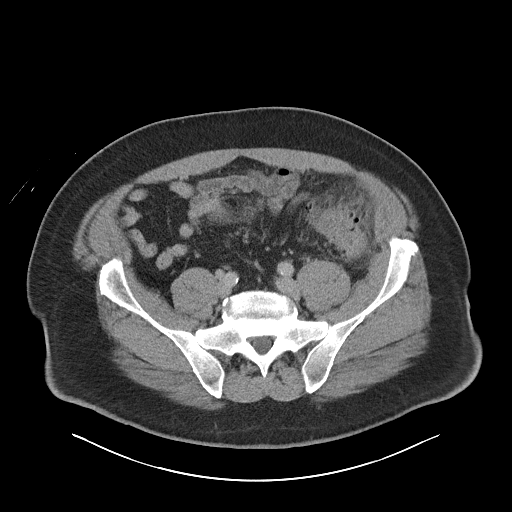
[im 45/104  soft-tissue]
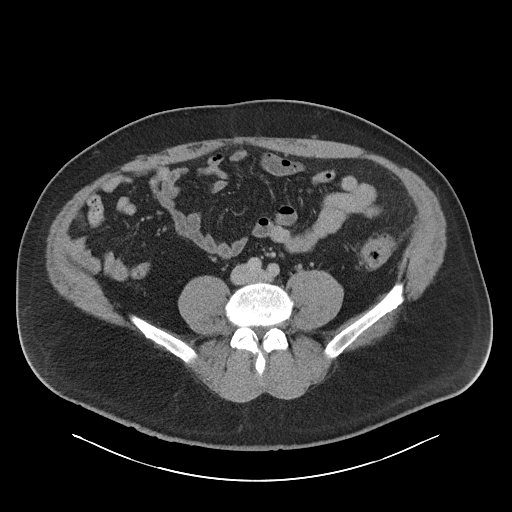
[im 52/104  soft-tissue]
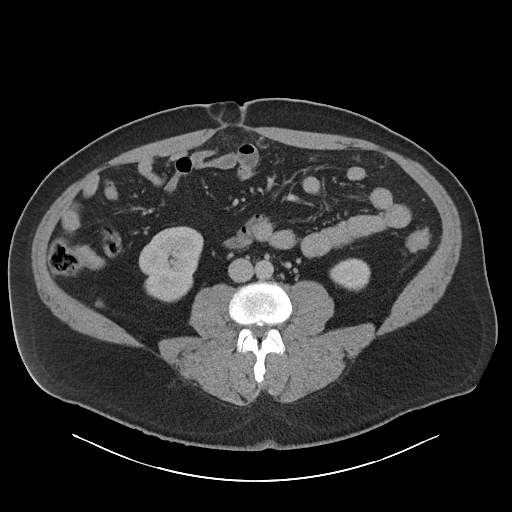
[im 59/104  soft-tissue]
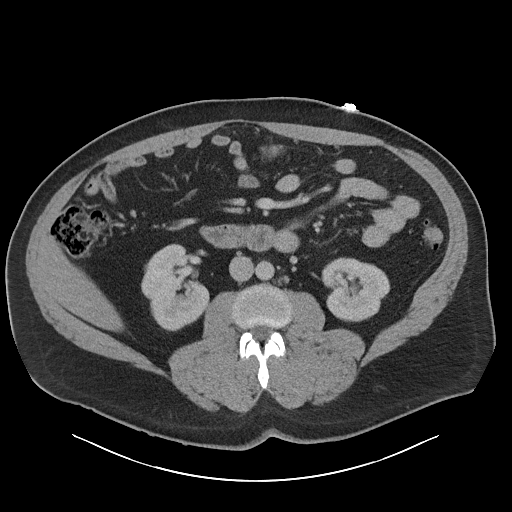
[im 67/104  soft-tissue]
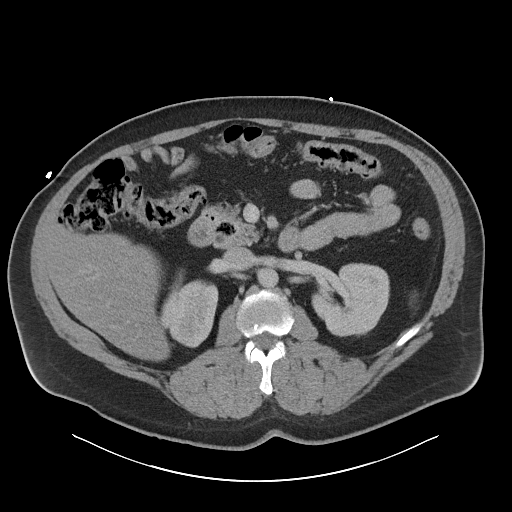
[im 67/104  bone]
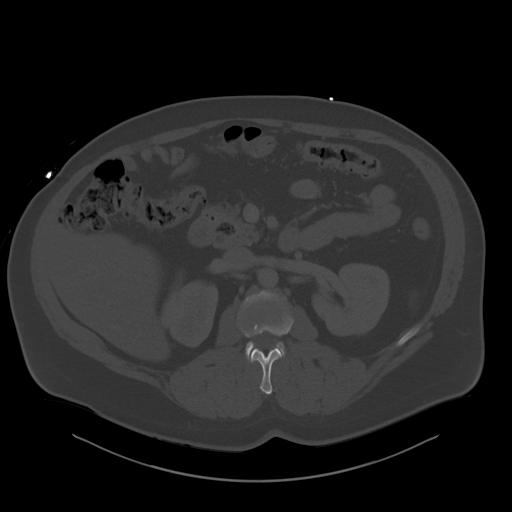
[im 74/104  soft-tissue]
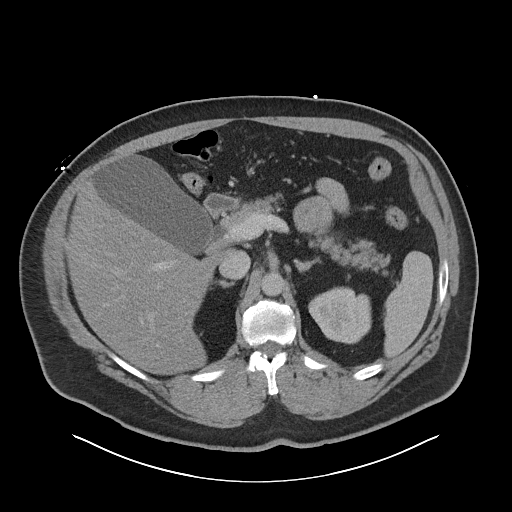
[im 81/104  soft-tissue]
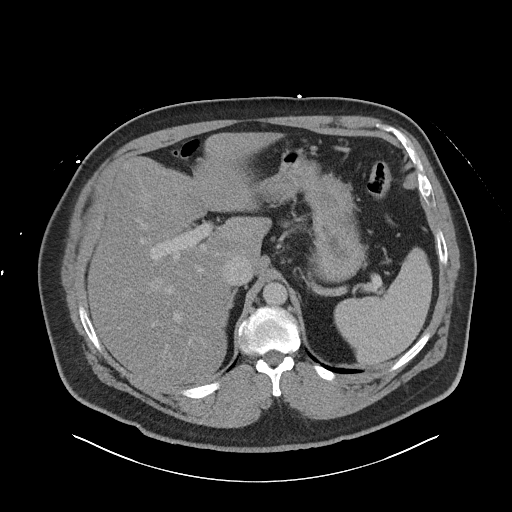
[im 89/104  soft-tissue]
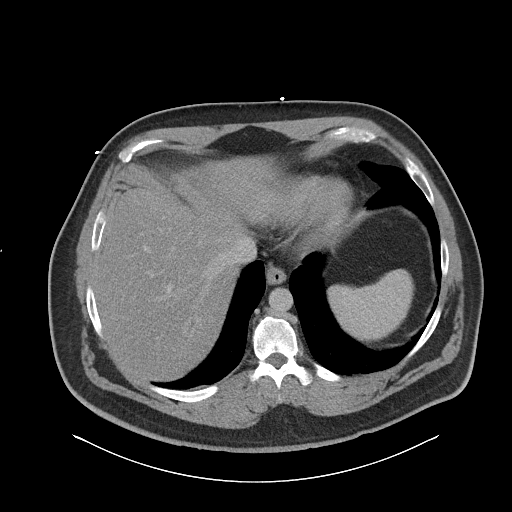
[im 96/104  soft-tissue]
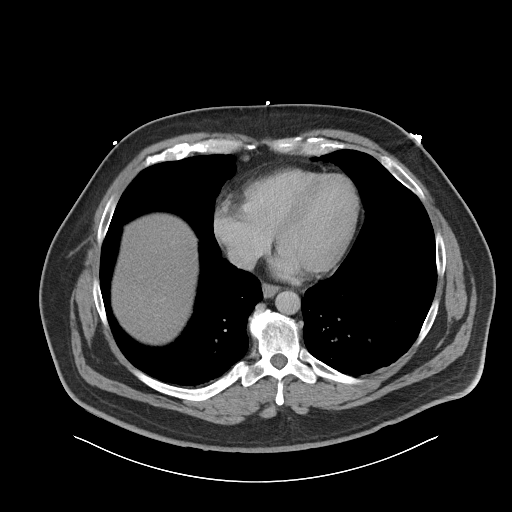

[Series 6: abdomen 3.0 mpr cor · coronal · 0.96mm/px · 3 of 120 slices shown]
[im 40/120  soft-tissue]
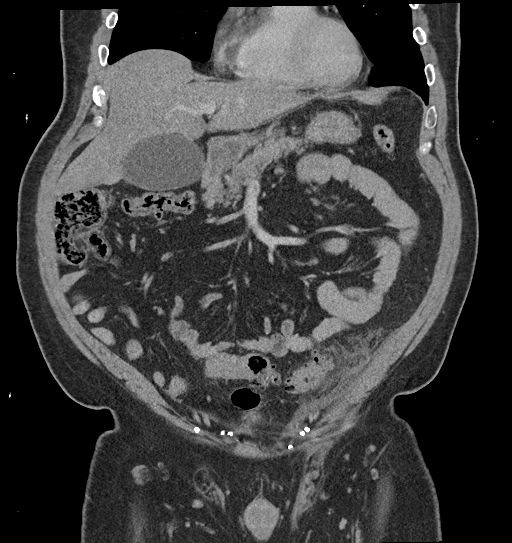
[im 53/120  soft-tissue]
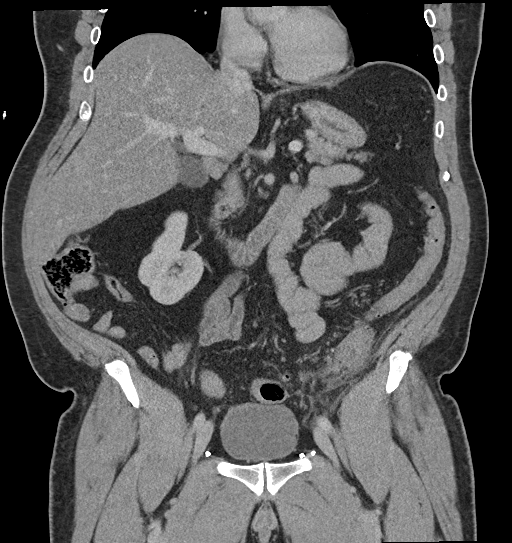
[im 67/120  soft-tissue]
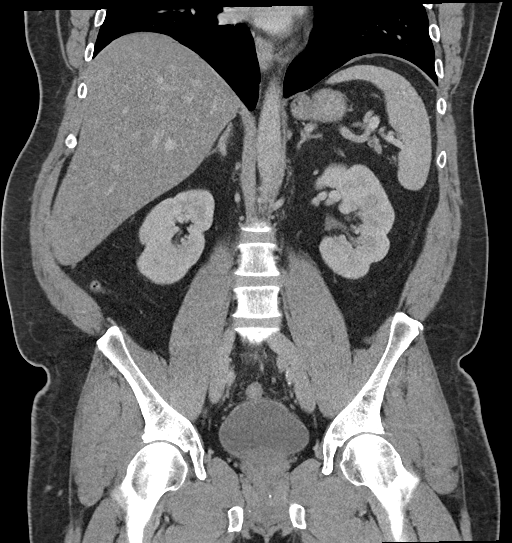

[16 of 46 positions shown; findings below may reference images not displayed]

FINDINGS: Lower chest: No acute abnormality.

Hepatobiliary: Fatty infiltration of the liver is noted. Gallbladder
is within normal limits.

Pancreas: Small duodenal diverticulum is noted adjacent to the
pancreatic head. The pancreas is otherwise within normal limits.

Spleen: Small cyst is noted within the spleen.

Adrenals/Urinary Tract: Adrenal glands are within normal limits.
Kidneys demonstrate a normal enhancement pattern bilaterally. Tiny 1
cm cyst is noted on the left in the lower pole. No renal calculi or
obstructive changes are noted. The bladder is decompressed.

Stomach/Bowel: There is again noted diverticular change within the
colon with evidence of diverticulitis at the junction of the
descending and sigmoid colons. No abscess or perforation is noted.
The more proximal colon appears within normal limits. The appendix
is within normal limits. Small bowel and stomach are unremarkable.

Vascular/Lymphatic: Aortic atherosclerosis. No enlarged abdominal or
pelvic lymph nodes.

Reproductive: Prostate is unremarkable.

Other: No free fluid is noted within the pelvis. Prior hernia repair
bilaterally is seen. No definitive recurrent hernias are noted.
Small fat containing umbilical hernia is noted.

Musculoskeletal: No acute or significant osseous findings.
IMPRESSION: Diverticulitis at the junction of the descending and sigmoid colon
without abscess or perforation.

Fatty liver.

Chronic changes as described.

## 2023-06-24 DIAGNOSIS — E669 Obesity, unspecified: Secondary | ICD-10-CM | POA: Diagnosis not present

## 2023-06-24 DIAGNOSIS — G8929 Other chronic pain: Secondary | ICD-10-CM | POA: Diagnosis not present

## 2023-06-24 DIAGNOSIS — Z6838 Body mass index (BMI) 38.0-38.9, adult: Secondary | ICD-10-CM | POA: Diagnosis not present

## 2023-06-24 DIAGNOSIS — Z809 Family history of malignant neoplasm, unspecified: Secondary | ICD-10-CM | POA: Diagnosis not present

## 2023-06-24 DIAGNOSIS — Z833 Family history of diabetes mellitus: Secondary | ICD-10-CM | POA: Diagnosis not present

## 2023-06-24 DIAGNOSIS — Z8249 Family history of ischemic heart disease and other diseases of the circulatory system: Secondary | ICD-10-CM | POA: Diagnosis not present

## 2023-10-20 ENCOUNTER — Encounter: Payer: Self-pay | Admitting: Family Medicine

## 2023-10-20 ENCOUNTER — Ambulatory Visit: Payer: Self-pay | Admitting: Family Medicine

## 2023-10-20 VITALS — BP 153/96 | HR 83 | Temp 98.0°F | Ht 73.0 in | Wt 330.8 lb

## 2023-10-20 DIAGNOSIS — Z0001 Encounter for general adult medical examination with abnormal findings: Secondary | ICD-10-CM | POA: Diagnosis not present

## 2023-10-20 DIAGNOSIS — E1165 Type 2 diabetes mellitus with hyperglycemia: Secondary | ICD-10-CM | POA: Diagnosis not present

## 2023-10-20 DIAGNOSIS — Z6841 Body Mass Index (BMI) 40.0 and over, adult: Secondary | ICD-10-CM

## 2023-10-20 DIAGNOSIS — M25562 Pain in left knee: Secondary | ICD-10-CM

## 2023-10-20 DIAGNOSIS — M25512 Pain in left shoulder: Secondary | ICD-10-CM | POA: Diagnosis not present

## 2023-10-20 DIAGNOSIS — E782 Mixed hyperlipidemia: Secondary | ICD-10-CM | POA: Diagnosis not present

## 2023-10-20 DIAGNOSIS — M545 Low back pain, unspecified: Secondary | ICD-10-CM

## 2023-10-20 DIAGNOSIS — G8929 Other chronic pain: Secondary | ICD-10-CM

## 2023-10-20 DIAGNOSIS — Z0000 Encounter for general adult medical examination without abnormal findings: Secondary | ICD-10-CM

## 2023-10-20 DIAGNOSIS — I10 Essential (primary) hypertension: Secondary | ICD-10-CM

## 2023-10-20 LAB — BAYER DCA HB A1C WAIVED: HB A1C (BAYER DCA - WAIVED): 6.8 % — ABNORMAL HIGH (ref 4.8–5.6)

## 2023-10-20 MED ORDER — LISINOPRIL 10 MG PO TABS: 10.0000 mg | ORAL_TABLET | Freq: Every day | ORAL | 3 refills | Status: DC

## 2023-10-20 MED ORDER — DICLOFENAC SODIUM 75 MG PO TBEC: 75.0000 mg | DELAYED_RELEASE_TABLET | Freq: Two times a day (BID) | ORAL | 1 refills | Status: DC

## 2023-10-20 NOTE — Progress Notes (Signed)
 Complete physical exam  Patient: Scott Weiss   DOB: [birth year]   48 y.o. Male  MRN: [id number]  Subjective:    Chief Complaint  Patient presents with   Annual Exam    Scott Weiss is a 48 y.o. male who presents today for a complete physical exam. He reports consuming a general diet.  No exercise. He is working on his feet 12+ hours a day.  He generally feels well. He reports sleeping well. He does have additional problems to discuss today.   Has been without insurance so he has been out of medications.   Left shoulder pain for 6 months. Unchanged. Intermittent pain, mostly at night. Not worse with activity. No ROM. Takes dual action-tylenol and advil with relief.   Left knee pain for about 1 year. Stiffness. Worse with activity or prolonged standing, squatting. Wearing a knee brace with some relief. No swelling.  No gout flares.   Most recent fall risk assessment:    10/20/2023   11:47 AM  Fall Risk   Falls in the past year? 0     Most recent depression screenings:    10/20/2023   11:48 AM 08/15/2021    8:17 AM  PHQ 2/9 Scores  PHQ - 2 Score 0 0  PHQ- 9 Score 2     Vision:Not within last year  and Dental: No current dental problems and No regular dental care   Past Medical History:  Diagnosis Date   Diverticulitis    Gout       Patient Care Team: Gabriel Earing, FNP as PCP - General (Family Medicine)   Outpatient Medications Prior to Visit  Medication Sig   Cholecalciferol (VITAMIN D3) 125 MCG (5000 UT) CAPS Take 10,000 Units by mouth.   CINNAMON PO Take by mouth.   Coenzyme Q10 (CO Q 10 PO) Take by mouth.   Misc Natural Products (JOINT HEALTH PO) Take by mouth.   Multiple Vitamin (MULTIVITAMIN) tablet Take 1 tablet by mouth daily.   Omega-3 Fatty Acids (FISH OIL PO) Take by mouth.   [DISCONTINUED] allopurinol (ZYLOPRIM) 100 MG tablet Take 2 tablets (200 mg total) by mouth daily.   [DISCONTINUED] atorvastatin (LIPITOR) 20 MG tablet Take 1 tablet (20 mg  total) by mouth daily.   [DISCONTINUED] CVS FIBER GUMMIES PO Take by mouth.   [DISCONTINUED] diclofenac (VOLTAREN) 75 MG EC tablet TAKE 1 TABLET BY MOUTH TWICE A DAY   [DISCONTINUED] lisinopril (ZESTRIL) 20 MG tablet Take 1 tablet (20 mg total) by mouth daily. NEW DOSE   [DISCONTINUED] methocarbamol (ROBAXIN) 500 MG tablet Take 1 tablet (500 mg total) by mouth 4 (four) times daily.   [DISCONTINUED] phentermine (ADIPEX-P) 37.5 MG tablet Take 1 tablet (37.5 mg total) by mouth daily before breakfast.   No facility-administered medications prior to visit.    ROS        Objective:     BP (!) 153/96   Pulse 83   Temp 98 F (36.7 C) (Temporal)   Ht 6\' 1"  (1.854 m)   Wt (!) 330 lb 12.8 oz (150 kg)   SpO2 97%   BMI 43.64 kg/m    Physical Exam Vitals and nursing note reviewed.  Constitutional:      General: He is not in acute distress.    Appearance: He is obese. He is not ill-appearing, toxic-appearing or diaphoretic.  HENT:     Head: Normocephalic.     Right Ear: Tympanic membrane, ear canal and external ear  normal.     Left Ear: Tympanic membrane, ear canal and external ear normal.     Nose: Nose normal.     Mouth/Throat:     Mouth: Mucous membranes are moist.     Pharynx: Oropharynx is clear.  Eyes:     Extraocular Movements: Extraocular movements intact.     Conjunctiva/sclera: Conjunctivae normal.     Pupils: Pupils are equal, round, and reactive to light.  Neck:     Thyroid: No thyroid mass, thyromegaly or thyroid tenderness.  Cardiovascular:     Rate and Rhythm: Normal rate and regular rhythm.     Pulses: Normal pulses.     Heart sounds: Normal heart sounds. No murmur heard.    No friction rub. No gallop.  Pulmonary:     Effort: Pulmonary effort is normal.     Breath sounds: Normal breath sounds.  Abdominal:     General: Bowel sounds are normal. There is no distension.     Palpations: Abdomen is soft. There is no mass.     Tenderness: There is no abdominal  tenderness. There is no guarding.  Musculoskeletal:        General: No swelling. Normal range of motion.     Left shoulder: No swelling, effusion, tenderness or bony tenderness. Normal range of motion. Normal strength.     Cervical back: Normal range of motion and neck supple. No tenderness.     Right lower leg: No edema.     Left lower leg: No edema.     Comments: + empty can test of left shoulder  Skin:    General: Skin is warm and dry.     Capillary Refill: Capillary refill takes less than 2 seconds.     Findings: No lesion or rash.  Neurological:     General: No focal deficit present.     Mental Status: He is alert and oriented to person, place, and time.  Psychiatric:        Mood and Affect: Mood normal.        Behavior: Behavior normal.        Thought Content: Thought content normal.      No results found for any visits on 10/20/23.     Assessment & Plan:    Routine Health Maintenance and Physical Exam  Scott Weiss was seen today for annual exam.  Diagnoses and all orders for this visit:  Routine general medical examination at a health care facility  Type 2 diabetes mellitus with hyperglycemia, without long-term current use of insulin (HCC) A1c pending. Has been diet controlled.  -     Bayer DCA Hb A1c Waived  Primary hypertension BP not at goal. Restart lisinopril. He will let me know how BP is in 2 weeks. Will follow up in 1 month.  -     CMP14+EGFR -     CBC with Differential/Platelet -     TSH -     lisinopril (ZESTRIL) 10 MG tablet; Take 1 tablet (10 mg total) by mouth daily.  Mixed hyperlipidemia Discussed restarting statin pending labs.  -     Lipid panel  Morbid obesity (HCC) Labs pending. Diet, exercise. Previously did well with phentermine. Discussed currently contraindicated due to elevated BP.  -     CMP14+EGFR -     CBC with Differential/Platelet -     Lipid panel -     TSH  Chronic pain of left knee Voltaren as below. Do not take other NSAIDs  with  then.            -     diclofenac (VOLTAREN) 75 MG EC tablet; Take 1 tablet (75 mg total) by mouth 2 (two) times daily.  Chronic left shoulder pain ? Rotator cuff pathology. Referral discussed and placed.  -     Ambulatory referral to Orthopedic Surgery    Immunization History  Administered Date(s) Administered   DTaP 10/05/1974, 01/27/1975, 04/08/1975, 10/25/1976   IPV 10/05/1974, 01/27/1975, 04/08/1975, 10/25/1976   MMR 10/25/1976   Tdap 01/14/2010    Health Maintenance  Topic Date Due   Colonoscopy  Never done   INFLUENZA VACCINE  11/10/2023 (Originally 03/13/2023)   DTaP/Tdap/Td (6 - Td or Tdap) 10/19/2024 (Originally 01/15/2020)   Pneumococcal Vaccine 37-55 Years old (1 of 2 - PCV) 10/19/2024 (Originally 04/05/1980)   Hepatitis C Screening  10/19/2024 (Originally 04/05/1992)   HIV Screening  10/19/2024 (Originally 04/05/1989)   COVID-19 Vaccine (1 - 2024-25 season) 11/04/2024 (Originally 04/13/2023)   HPV VACCINES  Aged Out    Discussed health benefits of physical activity, and encouraged him to engage in regular exercise appropriate for his age and condition.  Problem List Items Addressed This Visit       Cardiovascular and Mediastinum   Primary hypertension   Relevant Medications   lisinopril (ZESTRIL) 10 MG tablet   Other Relevant Orders   CMP14+EGFR   CBC with Differential/Platelet   TSH     Other   Morbid obesity (HCC)   Relevant Orders   CMP14+EGFR   CBC with Differential/Platelet   Lipid panel   TSH   Hyperlipidemia   Relevant Medications   lisinopril (ZESTRIL) 10 MG tablet   Other Relevant Orders   Lipid panel   Chronic bilateral low back pain without sciatica   Relevant Medications   diclofenac (VOLTAREN) 75 MG EC tablet   Other Visit Diagnoses       Routine general medical examination at a health care facility    -  Primary     Type 2 diabetes mellitus with hyperglycemia, without long-term current use of insulin (HCC)       Relevant  Medications   lisinopril (ZESTRIL) 10 MG tablet   Other Relevant Orders   Bayer DCA Hb A1c Waived     Chronic pain of left knee       Relevant Medications   diclofenac (VOLTAREN) 75 MG EC tablet     Chronic left shoulder pain       Relevant Medications   diclofenac (VOLTAREN) 75 MG EC tablet   Other Relevant Orders   Ambulatory referral to Orthopedic Surgery      Return in about 4 weeks (around 11/17/2023) for HTN.   The patient indicates understanding of these issues and agrees with the plan.  Gabriel Earing, FNP

## 2023-10-20 NOTE — Patient Instructions (Signed)
 Health Maintenance, Male  Adopting a healthy lifestyle and getting preventive care are important in promoting health and wellness. Ask your health care provider about:  The right schedule for you to have regular tests and exams.  Things you can do on your own to prevent diseases and keep yourself healthy.  What should I know about diet, weight, and exercise?  Eat a healthy diet    Eat a diet that includes plenty of vegetables, fruits, low-fat dairy products, and lean protein.  Do not eat a lot of foods that are high in solid fats, added sugars, or sodium.  Maintain a healthy weight  Body mass index (BMI) is a measurement that can be used to identify possible weight problems. It estimates body fat based on height and weight. Your health care provider can help determine your BMI and help you achieve or maintain a healthy weight.  Get regular exercise  Get regular exercise. This is one of the most important things you can do for your health. Most adults should:  Exercise for at least 150 minutes each week. The exercise should increase your heart rate and make you sweat (moderate-intensity exercise).  Do strengthening exercises at least twice a week. This is in addition to the moderate-intensity exercise.  Spend less time sitting. Even light physical activity can be beneficial.  Watch cholesterol and blood lipids  Have your blood tested for lipids and cholesterol at 50 years of age, then have this test every 5 years.  You may need to have your cholesterol levels checked more often if:  Your lipid or cholesterol levels are high.  You are older than 50 years of age.  You are at high risk for heart disease.  What should I know about cancer screening?  Many types of cancers can be detected early and may often be prevented. Depending on your health history and family history, you may need to have cancer screening at various ages. This may include screening for:  Colorectal cancer.  Prostate cancer.  Skin cancer.  Lung  cancer.  What should I know about heart disease, diabetes, and high blood pressure?  Blood pressure and heart disease  High blood pressure causes heart disease and increases the risk of stroke. This is more likely to develop in people who have high blood pressure readings or are overweight.  Talk with your health care provider about your target blood pressure readings.  Have your blood pressure checked:  Every 3-5 years if you are 9-95 years of age.  Every year if you are 85 years old or older.  If you are between the ages of 29 and 29 and are a current or former smoker, ask your health care provider if you should have a one-time screening for abdominal aortic aneurysm (AAA).  Diabetes  Have regular diabetes screenings. This checks your fasting blood sugar level. Have the screening done:  Once every three years after age 23 if you are at a normal weight and have a low risk for diabetes.  More often and at a younger age if you are overweight or have a high risk for diabetes.  What should I know about preventing infection?  Hepatitis B  If you have a higher risk for hepatitis B, you should be screened for this virus. Talk with your health care provider to find out if you are at risk for hepatitis B infection.  Hepatitis C  Blood testing is recommended for:  Everyone born from [birth year] through 1967.  Anyone  with known risk factors for hepatitis C.  Sexually transmitted infections (STIs)  You should be screened each year for STIs, including gonorrhea and chlamydia, if:  You are sexually active and are younger than 50 years of age.  You are older than 50 years of age and your health care provider tells you that you are at risk for this type of infection.  Your sexual activity has changed since you were last screened, and you are at increased risk for chlamydia or gonorrhea. Ask your health care provider if you are at risk.  Ask your health care provider about whether you are at high risk for HIV. Your health care provider  may recommend a prescription medicine to help prevent HIV infection. If you choose to take medicine to prevent HIV, you should first get tested for HIV. You should then be tested every 3 months for as long as you are taking the medicine.  Follow these instructions at home:  Alcohol use  Do not drink alcohol if your health care provider tells you not to drink.  If you drink alcohol:  Limit how much you have to 0-2 drinks a day.  Know how much alcohol is in your drink. In the [place] one drink equals one 12 oz bottle of beer (355 mL), one 5 oz glass of wine (148 mL), or one 1 oz glass of hard liquor (44 mL).  Lifestyle  Do not use any products that contain nicotine or tobacco. These products include cigarettes, chewing tobacco, and vaping devices, such as e-cigarettes. If you need help quitting, ask your health care provider.  Do not use street drugs.  Do not share needles.  Ask your health care provider for help if you need support or information about quitting drugs.  General instructions  Schedule regular health, dental, and eye exams.  Stay current with your vaccines.  Tell your health care provider if:  You often feel depressed.  You have ever been abused or do not feel safe at home.  Summary  Adopting a healthy lifestyle and getting preventive care are important in promoting health and wellness.  Follow your health care provider's instructions about healthy diet, exercising, and getting tested or screened for diseases.  Follow your health care provider's instructions on monitoring your cholesterol and blood pressure.  This information is not intended to replace advice given to you by your health care provider. Make sure you discuss any questions you have with your health care provider.  Document Revised: 12/18/2020 Document Reviewed: 12/18/2020  Elsevier Patient Education  2024 [employer].

## 2023-10-21 LAB — CBC WITH DIFFERENTIAL/PLATELET
Basophils Absolute: 0.1 x10E3/uL (ref 0.0–0.2)
Basos: 1 %
EOS (ABSOLUTE): 0.2 x10E3/uL (ref 0.0–0.4)
Eos: 3 %
Hematocrit: 51.5 % — ABNORMAL HIGH (ref 37.5–51.0)
Hemoglobin: 17 g/dL (ref 13.0–17.7)
Immature Grans (Abs): 0.1 x10E3/uL (ref 0.0–0.1)
Immature Granulocytes: 1 %
Lymphocytes Absolute: 2.1 x10E3/uL (ref 0.7–3.1)
Lymphs: 34 %
MCH: 29.2 pg (ref 26.6–33.0)
MCHC: 33 g/dL (ref 31.5–35.7)
MCV: 89 fL (ref 79–97)
Monocytes Absolute: 0.4 x10E3/uL (ref 0.1–0.9)
Monocytes: 7 %
Neutrophils Absolute: 3.4 x10E3/uL (ref 1.4–7.0)
Neutrophils: 54 %
Platelets: 290 x10E3/uL (ref 150–450)
RBC: 5.82 x10E6/uL — ABNORMAL HIGH (ref 4.14–5.80)
RDW: 12.8 % (ref 11.6–15.4)
WBC: 6.1 x10E3/uL (ref 3.4–10.8)

## 2023-10-21 LAB — CMP14+EGFR
ALT: 64 IU/L — ABNORMAL HIGH (ref 0–44)
AST: 50 IU/L — ABNORMAL HIGH (ref 0–40)
Albumin: 4.9 g/dL (ref 4.1–5.1)
Alkaline Phosphatase: 73 IU/L (ref 44–121)
BUN/Creatinine Ratio: 15 (ref 9–20)
BUN: 12 mg/dL (ref 6–24)
Bilirubin Total: 0.8 mg/dL (ref 0.0–1.2)
CO2: 23 mmol/L (ref 20–29)
Calcium: 10 mg/dL (ref 8.7–10.2)
Chloride: 100 mmol/L (ref 96–106)
Creatinine, Ser: 0.78 mg/dL (ref 0.76–1.27)
Globulin, Total: 2.4 g/dL (ref 1.5–4.5)
Glucose: 133 mg/dL — ABNORMAL HIGH (ref 70–99)
Potassium: 4.6 mmol/L (ref 3.5–5.2)
Sodium: 141 mmol/L (ref 134–144)
Total Protein: 7.3 g/dL (ref 6.0–8.5)
eGFR: 109 mL/min/{1.73_m2} (ref >59)

## 2023-10-21 LAB — LIPID PANEL
Chol/HDL Ratio: 4.8 ratio (ref 0.0–5.0)
Cholesterol, Total: 209 mg/dL — ABNORMAL HIGH (ref 100–199)
HDL: 44 mg/dL
LDL Chol Calc (NIH): 138 mg/dL — ABNORMAL HIGH (ref 0–99)
Triglycerides: 151 mg/dL — ABNORMAL HIGH (ref 0–149)
VLDL Cholesterol Cal: 27 mg/dL (ref 5–40)

## 2023-10-21 LAB — TSH: TSH: 0.974 u[IU]/mL (ref 0.450–4.500)

## 2023-10-22 ENCOUNTER — Other Ambulatory Visit: Payer: Self-pay | Admitting: Family Medicine

## 2023-10-22 ENCOUNTER — Other Ambulatory Visit: Payer: Self-pay | Admitting: *Deleted

## 2023-10-22 DIAGNOSIS — E1159 Type 2 diabetes mellitus with other circulatory complications: Secondary | ICD-10-CM

## 2023-10-22 DIAGNOSIS — E1169 Type 2 diabetes mellitus with other specified complication: Secondary | ICD-10-CM

## 2023-10-22 MED ORDER — ATORVASTATIN CALCIUM 40 MG PO TABS
40.0000 mg | ORAL_TABLET | Freq: Every day | ORAL | 3 refills | Status: AC
Start: 2023-10-22 — End: ?

## 2023-10-22 MED ORDER — METFORMIN HCL 500 MG PO TABS: 500.0000 mg | ORAL_TABLET | Freq: Two times a day (BID) | ORAL | 3 refills | Status: AC

## 2023-11-20 ENCOUNTER — Encounter: Payer: Self-pay | Admitting: Family Medicine

## 2023-11-20 ENCOUNTER — Ambulatory Visit: Admitting: Family Medicine

## 2023-11-20 VITALS — BP 135/85 | HR 75 | Temp 97.9°F | Ht 73.0 in | Wt 324.8 lb

## 2023-11-20 DIAGNOSIS — E1159 Type 2 diabetes mellitus with other circulatory complications: Secondary | ICD-10-CM

## 2023-11-20 DIAGNOSIS — E1165 Type 2 diabetes mellitus with hyperglycemia: Secondary | ICD-10-CM

## 2023-11-20 DIAGNOSIS — Z7984 Long term (current) use of oral hypoglycemic drugs: Secondary | ICD-10-CM

## 2023-11-20 DIAGNOSIS — M25512 Pain in left shoulder: Secondary | ICD-10-CM

## 2023-11-20 DIAGNOSIS — I152 Hypertension secondary to endocrine disorders: Secondary | ICD-10-CM

## 2023-11-20 DIAGNOSIS — E1169 Type 2 diabetes mellitus with other specified complication: Secondary | ICD-10-CM

## 2023-11-20 DIAGNOSIS — Z6841 Body Mass Index (BMI) 40.0 and over, adult: Secondary | ICD-10-CM

## 2023-11-20 DIAGNOSIS — G8929 Other chronic pain: Secondary | ICD-10-CM

## 2023-11-20 DIAGNOSIS — E785 Hyperlipidemia, unspecified: Secondary | ICD-10-CM

## 2023-11-20 DIAGNOSIS — M25562 Pain in left knee: Secondary | ICD-10-CM

## 2023-11-20 MED ORDER — RYBELSUS 7 MG PO TABS: 7.0000 mg | ORAL_TABLET | Freq: Every day | ORAL | 0 refills | Status: DC

## 2023-11-20 MED ORDER — RYBELSUS 14 MG PO TABS: 14.0000 mg | ORAL_TABLET | Freq: Every day | ORAL | 2 refills | Status: DC

## 2023-11-20 MED ORDER — LANCET DEVICE MISC
1.0000 | Freq: Three times a day (TID) | 0 refills | Status: AC
Start: 2023-11-20 — End: 2023-12-20

## 2023-11-20 MED ORDER — LANCETS MISC. MISC: 1.0000 | Freq: Three times a day (TID) | 0 refills | Status: AC

## 2023-11-20 MED ORDER — BLOOD GLUCOSE MONITORING SUPPL DEVI: 1.0000 | Freq: Three times a day (TID) | 0 refills | Status: DC

## 2023-11-20 MED ORDER — BLOOD GLUCOSE TEST VI STRP: 1.0000 | ORAL_STRIP | Freq: Three times a day (TID) | 3 refills | Status: AC

## 2023-11-20 NOTE — Progress Notes (Signed)
 Established Patient Office Visit  Subjective   Patient ID: Scott Weiss, male    DOB: 08-08-1976  Age: 48 y.o. MRN: [id number]  Chief Complaint  Patient presents with   Medical Management of Chronic Issues    HPI  T2DM Pt presents for evaluation of Type 2 diabetes mellitus.  Current symptoms include none. Patient denies increased appetite, nausea, paresthesia of the feet, polydipsia, polyuria, visual disturbances, vomiting, and weight loss.  Current diabetic medications include metformin  Compliant with meds - Yes  Current monitoring regimen: none  2. HTN Complaint with meds - Yes Current Medications - lisinopril 10 mg Pertinent ROS:  Visual Disturbances - No Chest pain - No Dyspnea - No Palpitations - No LE edema - No  3. Obesity Down 6 lbs from last visit. He has been increasing water in his diet and reducing soda intake. He has also been more active since he restarted NSAID for shoulder and knee pain.   Past Medical History:  Diagnosis Date   Diverticulitis    Gout       ROS As per HPI.    Objective:     BP 135/85   Pulse 75   Temp 97.9 F (36.6 C) (Temporal)   Ht 6\' 1"  (1.854 m)   Wt (!) 324 lb 12.8 oz (147.3 kg)   SpO2 98%   BMI 42.85 kg/m  Wt Readings from Last 3 Encounters:  11/20/23 (!) 324 lb 12.8 oz (147.3 kg)  10/20/23 (!) 330 lb 12.8 oz (150 kg)  08/15/21 (!) 309 lb 9.6 oz (140.4 kg)      Physical Exam Vitals and nursing note reviewed.  Constitutional:      General: He is not in acute distress.    Appearance: He is obese. He is not ill-appearing, toxic-appearing or diaphoretic.  Cardiovascular:     Rate and Rhythm: Normal rate and regular rhythm.     Heart sounds: No murmur heard. Pulmonary:     Effort: Pulmonary effort is normal.     Breath sounds: Normal breath sounds.  Musculoskeletal:     Right lower leg: No edema.     Left lower leg: No edema.  Skin:    General: Skin is warm and dry.  Neurological:     General: No  focal deficit present.     Mental Status: He is alert and oriented to person, place, and time.  Psychiatric:        Mood and Affect: Mood normal.        Behavior: Behavior normal.      No results found for any visits on 11/20/23.    The 10-year ASCVD risk score (Arnett DK, et al., 2019) is: 9.3%    Assessment & Plan:   Scott Weiss was seen today for medical management of chronic issues.  Diagnoses and all orders for this visit:  Type 2 diabetes mellitus with hyperglycemia, without long-term current use of insulin (HCC) Recent A1c at 6.8. continue metformin. Discussed rybelsus to manage A1c and assist with weight loss. Patient is interested in GLP but has fear of needles. Discussed administration of rybelsus, savings card and titration. 1 month supply of 3 mg dosage sample provided today.  -     Blood Glucose Monitoring Suppl DEVI; 1 each by Does not apply route in the morning, at noon, and at bedtime. May substitute to any manufacturer covered by patient's insurance. -     Glucose Blood (BLOOD GLUCOSE TEST STRIPS) STRP; 1 each by In  Vitro route in the morning, at noon, and at bedtime. May substitute to any manufacturer covered by patient's insurance. -     Lancet Device MISC; 1 each by Does not apply route in the morning, at noon, and at bedtime. May substitute to any manufacturer covered by patient's insurance. -     Lancets Misc. MISC; 1 each by Does not apply route in the morning, at noon, and at bedtime. May substitute to any manufacturer covered by patient's insurance. -     Semaglutide (RYBELSUS) 7 MG TABS; Take 1 tablet (7 mg total) by mouth daily. -     Semaglutide (RYBELSUS) 14 MG TABS; Take 1 tablet (14 mg total) by mouth daily.  Hypertension associated with type 2 diabetes mellitus (HCC) BP at goal.   Hyperlipidemia associated with type 2 diabetes mellitus (HCC) Last LDL at 138- restarted on statin.   Morbid obesity (HCC) Trending down. Diet, exercise, weight loss.    Chronic pain of left knee Chronic left shoulder pain Improved with voltaren.     Return in about 2 months (around 01/20/2024) for chronic follow up.  The patient indicates understanding of these issues and agrees with the plan.     Gabriel Earing, FNP

## 2023-12-11 ENCOUNTER — Other Ambulatory Visit (HOSPITAL_COMMUNITY): Payer: Self-pay

## 2023-12-11 ENCOUNTER — Telehealth: Payer: Self-pay | Admitting: Family Medicine

## 2023-12-12 ENCOUNTER — Other Ambulatory Visit (HOSPITAL_COMMUNITY): Payer: Self-pay

## 2023-12-12 ENCOUNTER — Telehealth: Payer: Self-pay

## 2023-12-12 NOTE — Telephone Encounter (Signed)
 Per test claim: The current 30 day co-pay is, $14.99.  No PA needed at this time.

## 2023-12-12 NOTE — Telephone Encounter (Signed)
 Pharmacy Patient Advocate Encounter   Received notification from Pt Calls Messages that prior authorization for RYBELSUS  is required/requested.   Insurance verification completed.   The patient is insured through  [employer]  .   Per test claim: The current 30 day co-pay is, $14.99.  No PA needed at this time. This test claim was processed through [facility]- copay amounts may vary at other pharmacies due to pharmacy/plan contracts, or as the patient moves through the different stages of their insurance plan.

## 2023-12-15 ENCOUNTER — Other Ambulatory Visit (HOSPITAL_COMMUNITY): Payer: Self-pay

## 2023-12-21 ENCOUNTER — Other Ambulatory Visit: Payer: Self-pay | Admitting: Family Medicine

## 2023-12-21 DIAGNOSIS — G8929 Other chronic pain: Secondary | ICD-10-CM

## 2023-12-22 ENCOUNTER — Other Ambulatory Visit: Payer: Self-pay | Admitting: Family Medicine

## 2023-12-22 DIAGNOSIS — E1165 Type 2 diabetes mellitus with hyperglycemia: Secondary | ICD-10-CM

## 2023-12-24 ENCOUNTER — Other Ambulatory Visit: Payer: Self-pay | Admitting: Family Medicine

## 2023-12-24 DIAGNOSIS — E1165 Type 2 diabetes mellitus with hyperglycemia: Secondary | ICD-10-CM

## 2024-01-18 ENCOUNTER — Other Ambulatory Visit: Payer: Self-pay | Admitting: Family Medicine

## 2024-01-18 DIAGNOSIS — G8929 Other chronic pain: Secondary | ICD-10-CM

## 2024-01-22 ENCOUNTER — Ambulatory Visit: Admitting: Family Medicine

## 2024-01-29 ENCOUNTER — Encounter: Payer: Self-pay | Admitting: Family Medicine

## 2024-01-29 ENCOUNTER — Ambulatory Visit: Admitting: Family Medicine

## 2024-01-29 VITALS — BP 141/85 | HR 86 | Temp 97.8°F | Ht 73.0 in | Wt 327.6 lb

## 2024-01-29 DIAGNOSIS — M25512 Pain in left shoulder: Secondary | ICD-10-CM

## 2024-01-29 DIAGNOSIS — Z7984 Long term (current) use of oral hypoglycemic drugs: Secondary | ICD-10-CM

## 2024-01-29 DIAGNOSIS — G8929 Other chronic pain: Secondary | ICD-10-CM

## 2024-01-29 DIAGNOSIS — E1159 Type 2 diabetes mellitus with other circulatory complications: Secondary | ICD-10-CM

## 2024-01-29 DIAGNOSIS — I152 Hypertension secondary to endocrine disorders: Secondary | ICD-10-CM

## 2024-01-29 DIAGNOSIS — M25562 Pain in left knee: Secondary | ICD-10-CM

## 2024-01-29 DIAGNOSIS — Z1211 Encounter for screening for malignant neoplasm of colon: Secondary | ICD-10-CM

## 2024-01-29 DIAGNOSIS — R632 Polyphagia: Secondary | ICD-10-CM

## 2024-01-29 DIAGNOSIS — E1165 Type 2 diabetes mellitus with hyperglycemia: Secondary | ICD-10-CM | POA: Diagnosis not present

## 2024-01-29 DIAGNOSIS — E785 Hyperlipidemia, unspecified: Secondary | ICD-10-CM

## 2024-01-29 DIAGNOSIS — E1169 Type 2 diabetes mellitus with other specified complication: Secondary | ICD-10-CM | POA: Diagnosis not present

## 2024-01-29 DIAGNOSIS — F40298 Other specified phobia: Secondary | ICD-10-CM

## 2024-01-29 MED ORDER — DICLOFENAC SODIUM 75 MG PO TBEC: 75.0000 mg | DELAYED_RELEASE_TABLET | Freq: Two times a day (BID) | ORAL | 3 refills | Status: DC

## 2024-01-29 MED ORDER — LISINOPRIL 20 MG PO TABS: 20.0000 mg | ORAL_TABLET | Freq: Every day | ORAL | 3 refills | Status: DC

## 2024-01-29 MED ORDER — LORAZEPAM 0.5 MG PO TABS: ORAL_TABLET | ORAL | 0 refills | Status: DC

## 2024-01-29 NOTE — Progress Notes (Addendum)
 Established Patient Office Visit  Subjective   Patient ID: Scott Weiss, male    DOB: 1975-11-20  Age: 48 y.o. MRN: [id number]  Chief Complaint  Patient presents with   Medical Management of Chronic Issues    HPI  T2DM Pt presents for evaluation of Type 2 diabetes mellitus.  Current symptoms include none. Patient denies increased appetite, nausea, paresthesia of the feet, polydipsia, polyuria, visual disturbances, vomiting, and weight loss.  Current diabetic medications include metformin , rybelsus   Compliant with meds - Yes  Feels like hunger has increased. Stays active on his feet at work. Struggling some with diet though since his appetite has increased.   Current monitoring regimen: none  2. HTN Complaint with meds - Yes Current Medications - lisinopril  10 mg Blood pressure at home - 140s/80s Pertinent ROS:  Visual Disturbances - No Chest pain - No Dyspnea - No Palpitations - No LE edema - No  3. Knee and shoulder pain Well controlled with Voltaren .   Past Medical History:  Diagnosis Date   Diverticulitis    Gout       ROS As per HPI.    Objective:     BP (!) 141/85   Pulse 86   Temp 97.8 F (36.6 C) (Temporal)   Ht 6' 1 (1.854 m)   Wt (!) 327 lb 9.6 oz (148.6 kg)   SpO2 97%   BMI 43.22 kg/m  Wt Readings from Last 3 Encounters:  01/29/24 (!) 327 lb 9.6 oz (148.6 kg)  11/20/23 (!) 324 lb 12.8 oz (147.3 kg)  10/20/23 (!) 330 lb 12.8 oz (150 kg)      Physical Exam Vitals and nursing note reviewed.  Constitutional:      General: He is not in acute distress.    Appearance: He is obese. He is not ill-appearing, toxic-appearing or diaphoretic.   Cardiovascular:     Rate and Rhythm: Normal rate and regular rhythm.     Heart sounds: No murmur heard. Pulmonary:     Effort: Pulmonary effort is normal.     Breath sounds: Normal breath sounds.   Musculoskeletal:     Right lower leg: No edema.     Left lower leg: No edema.   Skin:     General: Skin is warm and dry.   Neurological:     General: No focal deficit present.     Mental Status: He is alert and oriented to person, place, and time.   Psychiatric:        Mood and Affect: Mood normal.        Behavior: Behavior normal.    Diabetic Foot Exam - Simple   Simple Foot Form Diabetic Foot exam was performed with the following findings: Yes 01/29/2024  9:54 AM  Visual Inspection No deformities, no ulcerations, no other skin breakdown bilaterally: Yes Sensation Testing Intact to touch and monofilament testing bilaterally: Yes Pulse Check Posterior Tibialis and Dorsalis pulse intact bilaterally: Yes Comments     No results found for any visits on 01/29/24.  The 10-year ASCVD risk score (Arnett DK, et al., 2019) is: 10%    Assessment & Plan:   Corey was seen today for medical management of chronic issues.  Diagnoses and all orders for this visit:  Type 2 diabetes mellitus with hyperglycemia, without long-term current use of insulin (HCC) A1c pending today,. Goal of <7.  Continue rybelsus  and metformin  pending A1c. Declines injectable GLP1 due to significant fear of needles. He is on an  ACE/ARB and statin. Diet and exercise.  -     CMP14+EGFR  Hypertension associated with type 2 diabetes mellitus (HCC) Not at goal. Increase lisinopril  to 20 mg daily.  -     Bayer DCA Hb A1c Waived -     Microalbumin / creatinine urine ratio -     lisinopril  (ZESTRIL ) 20 MG tablet; Take 1 tablet (20 mg total) by mouth daily. -     Lipid panel  Hyperlipidemia associated with type 2 diabetes mellitus (HCC) Repeat fasting panel pending.   Morbid obesity (HCC) Trending up. Diet, exercise, weight loss. Will continue rybelsus .   Increased appetite Will check labs as below.  -     TSH + free T4  Chronic pain of left knee -     diclofenac  (VOLTAREN ) 75 MG EC tablet; Take 1 tablet (75 mg total) by mouth 2 (two) times daily.  Chronic left shoulder pain -     diclofenac   (VOLTAREN ) 75 MG EC tablet; Take 1 tablet (75 mg total) by mouth 2 (two) times daily.  Screening for colon cancer -     Cologuard  Patient has significant fear of needles. Unable to obtain labs today due to this. Will send in script for ativan to take prior to future lab draws. He will have someone drive him to appt if taking ativan. PDMP reviewed, no red flags.   Return in about 3 months (around 04/30/2024) for chronic follow up.  The patient indicates understanding of these issues and agrees with the plan.   Albertha Huger, FNP

## 2024-01-29 NOTE — Addendum Note (Signed)
 Addended by: Albertha Huger on: 01/29/2024 10:51 AM   Modules accepted: Orders

## 2024-01-30 ENCOUNTER — Ambulatory Visit: Payer: Self-pay | Admitting: Family Medicine

## 2024-01-30 LAB — MICROALBUMIN / CREATININE URINE RATIO
Creatinine, Urine: 125.9 mg/dL
Microalb/Creat Ratio: 5 mg/g{creat} (ref 0–29)
Microalbumin, Urine: 6.3 ug/mL

## 2024-01-30 MED ORDER — LORAZEPAM 0.5 MG PO TABS: ORAL_TABLET | ORAL | 0 refills | Status: DC

## 2024-01-30 NOTE — Addendum Note (Signed)
 Addended by: Albertha Huger on: 01/30/2024 11:37 AM   Modules accepted: Orders

## 2024-02-08 LAB — COLOGUARD: COLOGUARD: NEGATIVE

## 2024-02-25 ENCOUNTER — Other Ambulatory Visit (HOSPITAL_COMMUNITY): Payer: Self-pay

## 2024-02-25 ENCOUNTER — Telehealth: Payer: Self-pay

## 2024-02-25 NOTE — Telephone Encounter (Signed)
 Pharmacy Patient Advocate Encounter  Received notification from [facility] that Prior Authorization for Rybelsus  14 has been APPROVED from 02/25/24 to 02/24/25. Ran test claim, Copay is $14.99. This test claim was processed through [facility]- copay amounts may vary at other pharmacies due to pharmacy/plan contracts, or as the patient moves through the different stages of their insurance plan.   PA #/Case ID/Reference #: [id number]

## 2024-02-25 NOTE — Telephone Encounter (Signed)
 Left vm for cb

## 2024-02-25 NOTE — Telephone Encounter (Signed)
 Pharmacy Patient Advocate Encounter   Received notification from [employer] that prior authorization for Rybelsus  is required/requested.   Insurance verification completed.   The patient is insured through [facility] .   Per test claim: PA required; PA submitted to above mentioned insurance via CoverMyMeds Key/confirmation #/EOC [id number] Status is pending

## 2024-02-26 NOTE — Telephone Encounter (Signed)
 Pt aware of Rybelsus  approval.

## 2024-05-06 ENCOUNTER — Ambulatory Visit: Admitting: Family Medicine

## 2024-05-27 ENCOUNTER — Other Ambulatory Visit: Payer: Self-pay | Admitting: Family Medicine

## 2024-05-27 DIAGNOSIS — G8929 Other chronic pain: Secondary | ICD-10-CM

## 2024-06-03 ENCOUNTER — Encounter: Payer: Self-pay | Admitting: Family Medicine

## 2024-06-03 ENCOUNTER — Ambulatory Visit: Admitting: Family Medicine

## 2024-06-03 VITALS — BP 136/89 | HR 79 | Temp 98.1°F | Ht 73.0 in | Wt 325.8 lb

## 2024-06-03 DIAGNOSIS — E1165 Type 2 diabetes mellitus with hyperglycemia: Secondary | ICD-10-CM | POA: Diagnosis not present

## 2024-06-03 DIAGNOSIS — I152 Hypertension secondary to endocrine disorders: Secondary | ICD-10-CM

## 2024-06-03 DIAGNOSIS — E1159 Type 2 diabetes mellitus with other circulatory complications: Secondary | ICD-10-CM | POA: Diagnosis not present

## 2024-06-03 DIAGNOSIS — E1169 Type 2 diabetes mellitus with other specified complication: Secondary | ICD-10-CM | POA: Diagnosis not present

## 2024-06-03 DIAGNOSIS — E785 Hyperlipidemia, unspecified: Secondary | ICD-10-CM

## 2024-06-03 DIAGNOSIS — Z7985 Long-term (current) use of injectable non-insulin antidiabetic drugs: Secondary | ICD-10-CM

## 2024-06-03 DIAGNOSIS — M109 Gout, unspecified: Secondary | ICD-10-CM

## 2024-06-03 LAB — CMP14+EGFR
ALT: 58 IU/L — ABNORMAL HIGH (ref 0–44)
AST: 38 IU/L (ref 0–40)
Albumin: 4.7 g/dL (ref 4.1–5.1)
Alkaline Phosphatase: 63 IU/L (ref 47–123)
BUN/Creatinine Ratio: 15 (ref 9–20)
BUN: 13 mg/dL (ref 6–24)
Bilirubin Total: 0.8 mg/dL (ref 0.0–1.2)
CO2: 24 mmol/L (ref 20–29)
Calcium: 9.4 mg/dL (ref 8.7–10.2)
Chloride: 101 mmol/L (ref 96–106)
Creatinine, Ser: 0.85 mg/dL (ref 0.76–1.27)
Globulin, Total: 2.1 g/dL (ref 1.5–4.5)
Glucose: 112 mg/dL — ABNORMAL HIGH (ref 70–99)
Potassium: 4.7 mmol/L (ref 3.5–5.2)
Sodium: 140 mmol/L (ref 134–144)
Total Protein: 6.8 g/dL (ref 6.0–8.5)
eGFR: 106 mL/min/1.73 (ref >59)

## 2024-06-03 LAB — CBC WITH DIFFERENTIAL/PLATELET
Basophils Absolute: 0.1 x10E3/uL (ref 0.0–0.2)
Basos: 1 %
EOS (ABSOLUTE): 0.2 x10E3/uL (ref 0.0–0.4)
Eos: 4 %
Hematocrit: 50.2 % (ref 37.5–51.0)
Hemoglobin: 16.7 g/dL (ref 13.0–17.7)
Immature Grans (Abs): 0 x10E3/uL (ref 0.0–0.1)
Immature Granulocytes: 0 %
Lymphocytes Absolute: 2.2 x10E3/uL (ref 0.7–3.1)
Lymphs: 36 %
MCH: 30.3 pg (ref 26.6–33.0)
MCHC: 33.3 g/dL (ref 31.5–35.7)
MCV: 91 fL (ref 79–97)
Monocytes Absolute: 0.4 x10E3/uL (ref 0.1–0.9)
Monocytes: 7 %
Neutrophils Absolute: 3.3 x10E3/uL (ref 1.4–7.0)
Neutrophils: 51 %
Platelets: 282 x10E3/uL (ref 150–450)
RBC: 5.52 x10E6/uL (ref 4.14–5.80)
RDW: 12.3 % (ref 11.6–15.4)
WBC: 6.2 x10E3/uL (ref 3.4–10.8)

## 2024-06-03 LAB — LIPID PANEL
Chol/HDL Ratio: 3.4 ratio (ref 0.0–5.0)
Cholesterol, Total: 132 mg/dL (ref 100–199)
HDL: 39 mg/dL — ABNORMAL LOW (ref >39)
LDL Chol Calc (NIH): 74 mg/dL (ref 0–99)
Triglycerides: 103 mg/dL (ref 0–149)
VLDL Cholesterol Cal: 19 mg/dL (ref 5–40)

## 2024-06-03 LAB — TSH+FREE T4
Free T4: 1.09 ng/dL (ref 0.82–1.77)
TSH: 1.23 u[IU]/mL (ref 0.450–4.500)

## 2024-06-03 LAB — BAYER DCA HB A1C WAIVED: HB A1C (BAYER DCA - WAIVED): 6 % — ABNORMAL HIGH (ref 4.8–5.6)

## 2024-06-03 MED ORDER — SEMAGLUTIDE (1 MG/DOSE) 4 MG/3ML ~~LOC~~ SOPN: 1.0000 mg | PEN_INJECTOR | SUBCUTANEOUS | 1 refills | Status: AC

## 2024-06-03 MED ORDER — GLUCOSE BLOOD VI STRP: ORAL_STRIP | 12 refills | Status: AC

## 2024-06-03 MED ORDER — BLOOD GLUCOSE MONITORING SUPPL DEVI
1.0000 | Freq: Three times a day (TID) | 0 refills | Status: AC
Start: 2024-06-03 — End: ?

## 2024-06-03 MED ORDER — LISINOPRIL 40 MG PO TABS: 40.0000 mg | ORAL_TABLET | Freq: Every day | ORAL | 3 refills | Status: AC

## 2024-06-03 NOTE — Progress Notes (Signed)
 Established Patient Office Visit  Subjective   Patient ID: Scott Weiss, male    DOB: 04/24/1976  Age: 48 y.o. MRN: [id number]  Chief Complaint  Patient presents with   Medical Management of Chronic Issues    HPI  History of Present Illness   Scott Weiss is a 48 year old male with type 2 diabetes who presents for follow-up on diabetes management.  Glycemic control - Type 2 diabetes managed with Rybelsus  14 mg daily in the morning before other medications and metformin  - No significant side effects from Rybelsus , such as nausea - Occasional mild stomach upset managed with Pepto or Tums - Willing to try injectable GLP1 now. His wife is willing to give him the injection.  - Is not checking blood sugars.   Hypertension - Blood pressure readings typically around 148/90 mmHg, higher than target range - Currently taking lisinopril  20 mg daily  Dietary habits - Diet includes healthy snacks such as cucumbers and vegetables - Incorporates one to two vegetables per meal, usually steamed - Occasional consumption of snacks such as donuts - Increased water intake - Daily consumption of energy drinks and Coke  Physical activity - Walks dogs regularly for exercise  Gout symptoms - No recent gout flare-ups - Attributes improvement to dietary changes, increased water intake, and use of pH-balanced drinks        ROS As per HPI.    Objective:     BP 136/89   Pulse 79   Temp 98.1 F (36.7 C) (Temporal)   Ht 6' 1 (1.854 m)   Wt (!) 325 lb 12.8 oz (147.8 kg)   SpO2 100%   BMI 42.98 kg/m  Wt Readings from Last 3 Encounters:  06/03/24 (!) 325 lb 12.8 oz (147.8 kg)  01/29/24 (!) 327 lb 9.6 oz (148.6 kg)  11/20/23 (!) 324 lb 12.8 oz (147.3 kg)      Physical Exam Vitals and nursing note reviewed.  Constitutional:      General: He is not in acute distress.    Appearance: He is obese. He is not ill-appearing, toxic-appearing or diaphoretic.  Cardiovascular:     Rate and  Rhythm: Normal rate and regular rhythm.     Heart sounds: No murmur heard. Pulmonary:     Effort: Pulmonary effort is normal.     Breath sounds: Normal breath sounds.  Musculoskeletal:     Right lower leg: No edema.     Left lower leg: No edema.  Skin:    General: Skin is warm and dry.  Neurological:     General: No focal deficit present.     Mental Status: He is alert and oriented to person, place, and time.  Psychiatric:        Mood and Affect: Mood normal.        Behavior: Behavior normal.      No results found for any visits on 06/03/24.    The 10-year ASCVD risk score (Arnett DK, et al., 2019) is: 10.3%    Assessment & Plan:   Scott Weiss was seen today for medical management of chronic issues.  Diagnoses and all orders for this visit:  Type 2 diabetes mellitus with hyperglycemia, without long-term current use of insulin (HCC) -     Bayer DCA Hb A1c Waived -     Blood Glucose Monitoring Suppl DEVI; 1 each by Does not apply route in the morning, at noon, and at bedtime. May substitute to any manufacturer covered by patient's  insurance. -     glucose blood test strip; Use as instructed dx E11.9 -     Semaglutide , 1 MG/DOSE, 4 MG/3ML SOPN; Inject 1 mg into the skin once a week.  Hypertension associated with type 2 diabetes mellitus (HCC) -     CBC with Differential/Platelet -     CMP14+EGFR -     TSH + free T4 -     lisinopril  (ZESTRIL ) 40 MG tablet; Take 1 tablet (40 mg total) by mouth daily.  Hyperlipidemia associated with type 2 diabetes mellitus (HCC) -     Lipid panel  Morbid obesity (HCC) -     TSH + free T4  Gout, unspecified cause, unspecified chronicity, unspecified site   Assessment and Plan    Type 2 diabetes mellitus Transitioning from Rybelsus  to Ozempic for better weight loss and A1c control. Discussed potential side effects and need for prior authorization. - Continue Rybelsus  until Ozempic is available. - Initiate Ozempic 1 mg weekly once  Rybelsus  is discontinued. - Increase Ozempic to 2 mg if needed. - Monitor for side effects and report significant issues. - A1c pending.   Morbid obesity He has lost 2 lbs since his last visit. Discussed dietary habits and importance of healthy diet.  Hypertension Home blood pressure readings high. Plan to increase lisinopril  for better control. - Increase lisinopril  to 40 mg daily. - Monitor blood pressure regularly.  Hyperlipidemia Managed with statin. - Continue current statin therapy.   Gout Well controlled.   General Health Maintenance Discussed lifestyle modifications. No recent gout flare-ups.     Return in about 3 months (around 09/03/2024) for chronic follow up.  The patient indicates understanding of these issues and agrees with the plan.  Annabella CHRISTELLA Search, FNP

## 2024-06-04 ENCOUNTER — Ambulatory Visit: Payer: Self-pay | Admitting: Family Medicine

## 2024-06-09 ENCOUNTER — Telehealth: Payer: Self-pay

## 2024-06-09 ENCOUNTER — Other Ambulatory Visit (HOSPITAL_COMMUNITY): Payer: Self-pay

## 2024-06-09 NOTE — Telephone Encounter (Signed)
 Pharmacy Patient Advocate Encounter   Received notification from [employer] that prior authorization for Ozempic (1 MG/DOSE) 4MG /3ML pen-injectors  is required/requested.   Insurance verification completed.   The patient is insured through [employer].   Per test claim: PA required; PA submitted to above mentioned insurance via Latent Key/confirmation #/EOC [id number] Status is pending

## 2024-06-09 NOTE — Telephone Encounter (Signed)
 Pharmacy Patient Advocate Encounter  Received notification from [employer] that Prior Authorization for Ozempic (1 MG/DOSE) 4MG /3ML pen-injectors  has been APPROVED from 06/09/24 to 12/08/24. Ran test claim, Copay is $24.99. This test claim was processed through [facility]- copay amounts may vary at other pharmacies due to pharmacy/plan contracts, or as the patient moves through the different stages of their insurance plan.   PA #/Case ID/Reference #: [id number]

## 2024-06-11 ENCOUNTER — Other Ambulatory Visit (HOSPITAL_COMMUNITY): Payer: Self-pay

## 2024-08-28 ENCOUNTER — Other Ambulatory Visit: Payer: Self-pay | Admitting: Family Medicine

## 2024-08-28 DIAGNOSIS — G8929 Other chronic pain: Secondary | ICD-10-CM

## 2024-08-29 ENCOUNTER — Encounter (HOSPITAL_COMMUNITY): Payer: Self-pay

## 2024-08-29 ENCOUNTER — Emergency Department (HOSPITAL_COMMUNITY)
Admission: EM | Admit: 2024-08-29 | Discharge: 2024-08-29 | Disposition: A | Discharge status: Home / Self Care | Attending: Emergency Medicine | Admitting: Emergency Medicine

## 2024-08-29 ENCOUNTER — Other Ambulatory Visit: Payer: Self-pay

## 2024-08-29 ENCOUNTER — Emergency Department (HOSPITAL_COMMUNITY)

## 2024-08-29 DIAGNOSIS — I10 Essential (primary) hypertension: Secondary | ICD-10-CM | POA: Insufficient documentation

## 2024-08-29 DIAGNOSIS — G510 Bell's palsy: Secondary | ICD-10-CM | POA: Diagnosis not present

## 2024-08-29 DIAGNOSIS — Z79899 Other long term (current) drug therapy: Secondary | ICD-10-CM | POA: Insufficient documentation

## 2024-08-29 DIAGNOSIS — Z7984 Long term (current) use of oral hypoglycemic drugs: Secondary | ICD-10-CM | POA: Diagnosis not present

## 2024-08-29 DIAGNOSIS — R29810 Facial weakness: Secondary | ICD-10-CM

## 2024-08-29 DIAGNOSIS — E119 Type 2 diabetes mellitus without complications: Secondary | ICD-10-CM | POA: Diagnosis not present

## 2024-08-29 LAB — COMPREHENSIVE METABOLIC PANEL WITH GFR
ALT: 44 U/L (ref 0–44)
AST: 33 U/L (ref 15–41)
Albumin: 4.5 g/dL (ref 3.5–5.0)
Alkaline Phosphatase: 66 U/L (ref 38–126)
Anion gap: 11 (ref 5–15)
BUN: 13 mg/dL (ref 6–20)
CO2: 27 mmol/L (ref 22–32)
Calcium: 9.5 mg/dL (ref 8.9–10.3)
Chloride: 103 mmol/L (ref 98–111)
Creatinine, Ser: 0.76 mg/dL (ref 0.61–1.24)
GFR, Estimated: >60 mL/min
Glucose, Bld: 109 mg/dL — ABNORMAL HIGH (ref 70–99)
Potassium: 3.9 mmol/L (ref 3.5–5.1)
Sodium: 141 mmol/L (ref 135–145)
Total Bilirubin: 0.6 mg/dL (ref 0.0–1.2)
Total Protein: 7.6 g/dL (ref 6.5–8.1)

## 2024-08-29 LAB — I-STAT CHEM 8, ED
BUN: 14 mg/dL (ref 6–20)
Calcium, Ion: 1.16 mmol/L (ref 1.15–1.40)
Chloride: 103 mmol/L (ref 98–111)
Creatinine, Ser: 0.9 mg/dL (ref 0.61–1.24)
Glucose, Bld: 110 mg/dL — ABNORMAL HIGH (ref 70–99)
HCT: 46 % (ref 39.0–52.0)
Hemoglobin: 15.6 g/dL (ref 13.0–17.0)
Potassium: 3.8 mmol/L (ref 3.5–5.1)
Sodium: 143 mmol/L (ref 135–145)
TCO2: 28 mmol/L (ref 22–32)

## 2024-08-29 LAB — ETHANOL: Alcohol, Ethyl (B): <15 mg/dL

## 2024-08-29 LAB — CBC
HCT: 48.7 % (ref 39.0–52.0)
Hemoglobin: 15.8 g/dL (ref 13.0–17.0)
MCH: 29.9 pg (ref 26.0–34.0)
MCHC: 32.4 g/dL (ref 30.0–36.0)
MCV: 92.1 fL (ref 80.0–100.0)
Platelets: 427 K/uL — ABNORMAL HIGH (ref 150–400)
RBC: 5.29 MIL/uL (ref 4.22–5.81)
RDW: 12.8 % (ref 11.5–15.5)
WBC: 7.3 K/uL (ref 4.0–10.5)
nRBC: 0 % (ref 0.0–0.2)

## 2024-08-29 LAB — DIFFERENTIAL
Abs Immature Granulocytes: 0.05 K/uL (ref 0.00–0.07)
Basophils Absolute: 0.1 K/uL (ref 0.0–0.1)
Basophils Relative: 1 %
Eosinophils Absolute: 0.2 K/uL (ref 0.0–0.5)
Eosinophils Relative: 2 %
Immature Granulocytes: 1 %
Lymphocytes Relative: 35 %
Lymphs Abs: 2.5 K/uL (ref 0.7–4.0)
Monocytes Absolute: 0.4 K/uL (ref 0.1–1.0)
Monocytes Relative: 6 %
Neutro Abs: 4 K/uL (ref 1.7–7.7)
Neutrophils Relative %: 55 %

## 2024-08-29 LAB — CBG MONITORING, ED: Glucose-Capillary: 95 mg/dL (ref 70–99)

## 2024-08-29 LAB — PROTIME-INR
INR: 1 (ref 0.8–1.2)
Prothrombin Time: 13.4 s (ref 11.4–15.2)

## 2024-08-29 LAB — APTT: aPTT: 31 s (ref 24–36)

## 2024-08-29 MED ORDER — SODIUM CHLORIDE 0.9% FLUSH
3.0000 mL | Freq: Once | INTRAVENOUS | Status: AC
  Administered 2024-08-29: 3 mL via INTRAVENOUS

## 2024-08-29 MED ORDER — PREDNISONE 20 MG PO TABS
60.0000 mg | ORAL_TABLET | Freq: Once | ORAL | Status: AC
  Administered 2024-08-29: 60 mg via ORAL
  Filled 2024-08-29: qty 3

## 2024-08-29 MED ORDER — PREDNISONE 20 MG PO TABS: ORAL_TABLET | ORAL | 0 refills | Status: AC

## 2024-08-29 NOTE — ED Provider Notes (Signed)
 " [place] [facility] Provider Note   CSN: [id number] Arrival date & time: 08/29/24  0940     Patient presents with: Numbness   Scott Weiss is a 49 y.o. male with a history of hypertension, obesity, hyperlipidemia, and diabetes who presents to the ED with right-sided facial droop/numbness that began this morning at 0830. The patient stated that he had the flu which lasted approximately 1 week.  He noticed a few days ago he started to lose taste on the right side of his mouth and thought it could possibly be post-flu symptoms.  However, this morning, he noticed that he was having difficulty eating on the right side of his mouth and it was not opening as normal. He noticed at home some facial asymmetry when he looked in the mirror, his significant other noticed some slurred speech, and he immediately presented to the emergency department.  At this time he denies any dizziness, headache, visual changes, weakness, numbness/tingling in upper or lower extremities.  He denies any fevers, nausea, vomiting, or diarrhea.  He denies any near-syncope/syncope.  The patient is in no acute distress.   HPI     Prior to Admission medications  Medication Sig Start Date End Date Taking? Authorizing Provider  predniSONE  (DELTASONE ) 20 MG tablet 60 mg daily for 5 days followed by 5-day taper 08/29/24  Yes Rafi Kenneth L, PA  Accu-Chek Softclix Lancets lancets USE TO CHECK SUGAR IN [place], AT NOON, AND AT BEDTIME 12/24/23   Joesph Annabella HERO, FNP  atorvastatin  (LIPITOR) 40 MG tablet Take 1 tablet (40 mg total) by mouth daily. 10/22/23   Joesph Annabella HERO, FNP  Blood Glucose Monitoring Suppl DEVI 1 each by Does not apply route in the morning, at noon, and at bedtime. May substitute to any manufacturer covered by patient's insurance. 06/03/24   Joesph Annabella HERO, FNP  Cholecalciferol (VITAMIN D3) 125 MCG (5000 UT) CAPS Take 10,000 Units by mouth.    [provider]   CINNAMON PO Take by mouth.    [provider]  Coenzyme Q10 (CO Q 10 PO) Take by mouth.    [provider]  diclofenac  (VOLTAREN ) 75 MG EC tablet TAKE 1 TABLET BY MOUTH TWICE A DAY 08/30/24   Joesph Annabella HERO, FNP  glucose blood test strip Use as instructed dx E11.9 06/03/24   Joesph Annabella HERO, FNP  lisinopril  (ZESTRIL ) 40 MG tablet Take 1 tablet (40 mg total) by mouth daily. 06/03/24   Joesph Annabella HERO, FNP  metFORMIN  (GLUCOPHAGE ) 500 MG tablet Take 1 tablet (500 mg total) by mouth 2 (two) times daily with a meal. 10/22/23   Joesph Annabella HERO, FNP  Misc Natural Products (JOINT HEALTH PO) Take by mouth.    [provider]  Multiple Vitamin (MULTIVITAMIN) tablet Take 1 tablet by mouth daily.    [provider]  Omega-3 Fatty Acids (FISH OIL PO) Take by mouth.    [provider]  Semaglutide , 1 MG/DOSE, 4 MG/3ML SOPN Inject 1 mg into the skin once a week. 06/03/24   Joesph Annabella HERO, FNP    Allergies: Patient has no known allergies.    Review of Systems  Neurological:  Positive for numbness.    Updated Vital Signs BP 128/85   Pulse 83   Temp 98.2 F (36.8 C)   Resp 14   SpO2 98%   Physical Exam Vitals and nursing note reviewed.  Constitutional:      General: He  is not in acute distress.    Appearance: Normal appearance.  HENT:     Head: Normocephalic and atraumatic.  Eyes:     Extraocular Movements: Extraocular movements intact.     Conjunctiva/sclera: Conjunctivae normal.     Pupils: Pupils are equal, round, and reactive to light.  Cardiovascular:     Rate and Rhythm: Normal rate and regular rhythm.     Pulses: Normal pulses.  Pulmonary:     Effort: Pulmonary effort is normal. No respiratory distress.     Comments: Patient has no difficulty speaking in complete sentences. Musculoskeletal:        General: Normal range of motion.     Cervical back: Normal range of motion.  Skin:    General: Skin is warm and dry.      Capillary Refill: Capillary refill takes less than 2 seconds.  Neurological:     General: No focal deficit present.     Mental Status: He is alert. Mental status is at baseline.     Comments: Patient alert and oriented.  Speech clear and appropriate.  No aphasia or dysarthria.   Cranial nerves III - VI, VIII-XII intact: Cranial nerve VII -weakness involving the entire right side of the face, including decreased ability to raise right eyebrow and decreased movement of the right mouth corner. Motor strength 5-5 in all extremities with normal tone and no pronator drift.   Sensation intact to light touch in upper and lower extremities bilaterally.  Coordination normal with intact finger-nose. Gait steady and non-ataxic when ambulated in ED.   Psychiatric:        Mood and Affect: Mood normal.     (all labs ordered are listed, but only abnormal results are displayed) Labs Reviewed  CBC - Abnormal; Notable for the following components:      Result Value   Platelets 427 (*)    All other components within normal limits  COMPREHENSIVE METABOLIC PANEL WITH GFR - Abnormal; Notable for the following components:   Glucose, Bld 109 (*)    All other components within normal limits  I-STAT CHEM 8, ED - Abnormal; Notable for the following components:   Glucose, Bld 110 (*)    All other components within normal limits  PROTIME-INR  APTT  DIFFERENTIAL  ETHANOL  LYME DISEASE SEROLOGY W/REFLEX  CBG MONITORING, ED  CBG MONITORING, ED    EKG: None  Radiology: No results found.   Procedures   Medications Ordered in the ED  sodium chloride  flush (NS) 0.9 % injection 3 mL (3 mLs Intravenous Given 08/29/24 1004)  predniSONE  (DELTASONE ) tablet 60 mg (60 mg Oral Given 08/29/24 1139)                                 Medical Decision Making Amount and/or Complexity of Data Reviewed Labs: ordered.  Risk Prescription drug management.   Patient presents to the ED for: right sided facial  weakness  This involves an extensive number of treatment options and is a complaint that carries with it a high risk of complications  Differential diagnosis includes:  CVA/TIA Bells palsy  Co-morbid conditions: Hypertension, diabetes   Additional history/records obtained and reviewed: Additional history obtained from  wife External records from outside source obtained and reviewed including several family medicine visits    Data Reviewed / Actions Taken: Labs ordered - no acute laboratory findings   Management / Treatments: Patient given first dose  prednisone  treatment for Bell's palsy  I have reviewed the patients home medicines and have made adjustments as needed  ED Course / Reassessments: Problem List:  49 year old male presented for right face numbness. Initial assessment included history, physical exam, and review of prior medical records.  Given patient's initial presentation of right sided facial droop with moderately intact eyebrow raise and no difficulty opening and closing the right eye, timing of symptoms, and slurred speech - code stroke was called.  Patient was evaluated by neuro at CT scan who canceled code stroke and stated to proceed with treatment for Bell's palsy.  Overall workup was reassuring.  Exam showed right cranial nerve VII weakness, intact strength and sensation in all extremities, no additional focal neurological deficits, making CVA unlikely.  No vesicular rash, ear pain, or hearing changes to suggest Ramsay Hunt syndrome.  Presentation is most consistent with Bell's palsy.  Given symptom onset within 72 hours, patient was treated with oral corticosteroids and prescribed same.  Eye protection measures were discussed in case of incomplete right eye closure.  No emergent imaging indicated at this time.  Patient remained hemodynamically stable throughout ED course and was discharged with return precautions and outpatient follow-up.  Consultations:  Neurology -  Vanessa MD Consult recommendations incorporated into plan: cancel code stroke and proceed with laboratory studies and treatment for Bell's palsy   Disposition: Disposition: Discharge with close follow-up with PCP and neurology for further evaluation and care  Rationale for disposition: stable for discharge  The disposition plan and rationale were discussed with the patient at the bedside, all questions were addressed, and the patient demonstrated understanding.  This note was produced using Electronics Engineer. While I have reviewed and verified all clinical information, transcription errors may remain.      Final diagnoses:  Bell's palsy    ED Discharge Orders          Ordered    predniSONE  (DELTASONE ) 20 MG tablet       Note to Pharmacy: 60 mg daily for 5 days followed by 5-day taper   08/29/24 1242               Willma Duwaine CROME, [place] 09/05/24 1434  "

## 2024-08-29 NOTE — ED Provider Triage Note (Signed)
 Emergency Medicine Provider Triage Evaluation Note  Scott Weiss , a 49 y.o. male  was evaluated in triage.  Pt complains of hypertension, obesity, hyperlipidemia, diabetes who presents concern for around 8 AM this morning the patient felt that he was having some difficulty eating on the right side, mouth not opening like normal.  Some notable facial asymmetry, concern for possible slurred speech.  He also endorses some intermittent dizziness for the last few days.  He is just getting over the flu.  Denies any dizziness or headache at this time.  Denies any arm or leg weakness.  Denies any difference in sensation of arms, legs, face.  Review of Systems  Positive: Facial droop, slurred speech Negative:   Physical Exam  BP (!) 188/109 (BP Location: Right Arm)   Pulse 98   Temp 98.2 F (36.8 C)   Resp 18   SpO2 99%  Gen:   Awake, no distress   Resp:  Normal effort  MSK:   Moves extremities without difficulty  Other:  Patient with some right-sided facial droop, we do not appreciate any overt slurred speech, he texting 5/5 of bilateral upper and lower extremities.  Normal finger-nose-finger, no pronator drift.  No numbness or tingling of the bilateral upper or lower extremities, no numbness or tingling of face.  Medical Decision Making  Medically screening exam initiated at 10:13 AM.  Appropriate orders placed.  Scott Weiss was informed that the remainder of the evaluation will be completed by another provider, this initial triage assessment does not replace that evaluation, and the importance of remaining in the ED until their evaluation is complete.  Somewhat equivocal for Bell's versus true neurologic deficit, code stroke was activated but suspect this may be from peripheral nerve and not true central CNS etiology   Rosan Sherlean DEL, PA-C 08/29/24 1013

## 2024-08-29 NOTE — Consult Note (Signed)
 NEUROLOGY CONSULT NOTE   Date of service: August 29, 2024 Patient Name: Scott Weiss MRN:  [id number] DOB:  [birth year] Chief Complaint: code stroke, R facial droop and weak eyelid closure Requesting Provider: Levander Houston, MD  History of Present Illness  Scott Weiss is a 49 y.o. male with past medical history of diverticulitis, gout who was activated as a code stroke on arrival to the ED for a right facial droop that involves upper as well as the lower right face.  Patient reports that he was eating a sandwich this morning and noticed that his right face would not open as much as the left one.  He came to the ED concern for a stroke.  He denies any facial numbness or tingling.  He does endorse that the food has been feeling more greasy over the last couple days.  He has no arm or leg weakness or numbness.  He denies any prior history of strokes.  He endorses that back in October last year, he did go to [place] and [place] and [place] the [place].  He denies any tick bites.  He denies any rash on his skin.  He denies any vesicles or ear itching.  LKW: 0830 Modified rankin score: 0-Completely asymptomatic and back to baseline post- stroke IV Thrombolysis: Not offered, low suspicion for stroke.  Clinically appears consistent with Bell's palsy.   EVT: Not offered, low suspicion for stroke.  Clinically appears consistent with Bell's palsy.    NIHSS components Score: Comment  1a Level of Conscious 0[]  1[]  2[]  3[]      1b LOC Questions 0[]  1[]  2[]       1c LOC Commands 0[]  1[]  2[]       2 Best Gaze 0[]  1[]  2[]       3 Visual 0[]  1[]  2[]  3[]      4 Facial Palsy 0[]  1[x]  2[]  3[]    Right facial droop involving upper as well as lower face  5a Motor Arm - left 0[]  1[]  2[]  3[]  4[]  UN[]    5b Motor Arm - Right 0[]  1[]  2[]  3[]  4[]  UN[]    6a Motor Leg - Left 0[]  1[]  2[]  3[]  4[]  UN[]    6b Motor Leg - Right 0[]  1[]  2[]  3[]  4[]  UN[]    7 Limb Ataxia 0[]  1[]  2[]  UN[]       8 Sensory 0[]  1[]  2[]  UN[]      9 Best Language 0[]  1[]  2[]  3[]      10 Dysarthria 0[]  1[]  2[]  UN[]      11 Extinct. and Inattention 0[]  1[]  2[]       TOTAL: 1      ROS  Comprehensive ROS performed and pertinent positives documented in HPI   Past History   Past Medical History:  Diagnosis Date   Diverticulitis    Gout     Past Surgical History:  Procedure Laterality Date   HERNIA REPAIR     groin    Family History: Family History  Problem Relation Age of Onset   Cancer Mother    Cirrhosis Mother    COPD Father    Liver cancer Sister     Social History  reports that he has never smoked. He has never used smokeless tobacco. He reports that he does not drink alcohol and does not use drugs.  Allergies[1]  Medications  Current Medications[2]  Vitals   Vitals:   08/29/24 0944 08/29/24 1021 08/29/24 1130  BP: (!) 188/109  128/85  Pulse: 98  83  Resp: 18  14  Temp: 98.2 F (36.8 C)    SpO2: 99% 100% 98%    There is no height or weight on file to calculate BMI.   Physical Exam   General: Laying comfortably in bed; in no acute distress.  HENT: Normal oropharynx and mucosa. Normal external appearance of ears and nose.  Neck: Supple, no pain or tenderness  CV: No JVD. No peripheral edema.  Pulmonary: Symmetric Chest rise. Normal respiratory effort.  Abdomen: Soft to touch, non-tender.  Ext: No cyanosis, edema, or deformity  Skin: No rash. Normal palpation of skin.   Musculoskeletal: Normal digits and nails by inspection. No clubbing.   Neurologic Examination  Mental status/Cognition: Alert, oriented to self, place, month and year, good attention.  Speech/language: Fluent, comprehension intact, object naming intact, repetition intact.  Cranial nerves:   CN II Pupils equal and reactive to light, no VF deficits    CN III,IV,VI EOM intact, no gaze preference or deviation, no nystagmus    CN V normal sensation in V1, V2, and V3 segments bilaterally    CN VII  Right upper and lower facial droop with weak eyelid closure.   CN VIII normal hearing to speech    CN IX & X normal palatal elevation, no uvular deviation    CN XI 5/5 head turn and 5/5 shoulder shrug bilaterally    CN XII midline tongue protrusion    Motor:  Muscle bulk: normal, tone normal, pronator drift none tremor none Mvmt Root Nerve  Muscle Right Left Comments  SA C5/6 Ax Deltoid 5 5   EF C5/6 Mc Biceps 5 5   EE C6/7/8 Rad Triceps 5 5   WF C6/7 Med FCR     WE C7/8 PIN ECU     F Ab C8/T1 U ADM/FDI 5 5   HF L1/2/3 Fem Illopsoas 5 5   KE L2/3/4 Fem Quad 5 5   DF L4/5 D Peron Tib Ant 5 5   PF S1/2 Tibial Grc/Sol 5 5    Sensation:  Light touch Intact throughout   Pin prick    Temperature    Vibration   Proprioception    Coordination/Complex Motor:  - Finger to Nose intact bilaterally - Heel to shin intact bilaterally - Rapid alternating movement are normal - Gait: Stride length normal. Arm swing normal. Base width narrow  Labs/Imaging/Neurodiagnostic studies   CBC:  Recent Labs  Lab 09-12-2024 1004 12-Sep-2024 1012  WBC 7.3  --   NEUTROABS 4.0  --   HGB 15.8 15.6  HCT 48.7 46.0  MCV 92.1  --   PLT 427*  --    Basic Metabolic Panel:  Lab Results  Component Value Date   NA 143 September 12, 2024   K 3.8 09-12-24   CO2 27 September 12, 2024   GLUCOSE 110 (H) 2024/09/12   BUN 14 2024/09/12   CREATININE 0.90 09/12/2024   CALCIUM  9.5 09/12/24   GFRNONAA >60 09-12-2024   GFRAA 120 09/18/2020   Lipid Panel:  Lab Results  Component Value Date   LDLCALC 74 06/03/2024   HgbA1c:  Lab Results  Component Value Date   HGBA1C 6.0 (H) 06/03/2024   Urine Drug Screen: No results found for: LABOPIA, COCAINSCRNUR, LABBENZ, AMPHETMU, THCU, LABBARB  Alcohol Level     Component Value Date/Time   ETH <15 09/12/24 1004   INR  Lab Results  Component Value Date   INR 1.0 12-Sep-2024   APTT  Lab Results  Component Value Date   APTT 31 08/29/2024   AED levels: No  results found for: PHENYTOIN, ZONISAMIDE, LAMOTRIGINE, LEVETIRACETA  ASSESSMENT   Scott Weiss is a 49 y.o. male who presents with right upper and lower facial droop with weak right eyelid closure and decreased taste to the right side of his mouth.  His symptoms and presentation is most consistent with Bell's palsy and a lower motor neuron facial nerve pathology.  There are no tick bites or rash.  There are no vesicles and no seizures.  There is no white scaling noted on his tongue.  Low suspicion for melkerson rosethnal, lichen planus.  He can still close his right eye relatively well and there is low concern for corneal drying.  RECOMMENDATIONS  - Valacyclovir 1000mg  TID x 7 days - Prednisone  60mg  daily x 7 days - Lyme PCR. - Lubricating eye drops as needed - ointment formulation or artificial tears at bedtime - Tape eye shut to prevent dryness - Follow up with PCP within 1 week for close monitoring of eye closure. If this does not improve, recommend STAT referral to ophthalmology. - Will cancel CODE STROKE. - we will signoff. ______________________________________________________________________  Plan discussed with Dr. Levander with the ED team.  Signed, Aahil Fredin, MD [facility]     [1] No Known Allergies [2] No current facility-administered medications for this encounter.  Current Outpatient Medications:    predniSONE  (DELTASONE ) 20 MG tablet, 60 mg daily for 5 days followed by 5-day taper, Disp: 30 tablet, Rfl: 0   Accu-Chek Softclix Lancets lancets, USE TO CHECK SUGAR IN MORNING, AT NOON, AND AT BEDTIME, Disp: 100 each, Rfl: 12   atorvastatin  (LIPITOR) 40 MG tablet, Take 1 tablet (40 mg total) by mouth daily., Disp: 90 tablet, Rfl: 3   Blood Glucose Monitoring Suppl DEVI, 1 each by Does not apply route in the morning, at noon, and at bedtime. May substitute to any manufacturer covered by patient's insurance., Disp: 1 each, Rfl: 0    Cholecalciferol (VITAMIN D3) 125 MCG (5000 UT) CAPS, Take 10,000 Units by mouth., Disp: , Rfl:    CINNAMON PO, Take by mouth., Disp: , Rfl:    Coenzyme Q10 (CO Q 10 PO), Take by mouth., Disp: , Rfl:    diclofenac  (VOLTAREN ) 75 MG EC tablet, TAKE 1 TABLET BY MOUTH TWICE A DAY, Disp: 180 tablet, Rfl: 0   glucose blood test strip, Use as instructed dx E11.9, Disp: 100 each, Rfl: 12   lisinopril  (ZESTRIL ) 40 MG tablet, Take 1 tablet (40 mg total) by mouth daily., Disp: 90 tablet, Rfl: 3   metFORMIN  (GLUCOPHAGE ) 500 MG tablet, Take 1 tablet (500 mg total) by mouth 2 (two) times daily with a meal., Disp: 180 tablet, Rfl: 3   Misc Natural Products (JOINT HEALTH PO), Take by mouth., Disp: , Rfl:    Multiple Vitamin (MULTIVITAMIN) tablet, Take 1 tablet by mouth daily., Disp: , Rfl:    Omega-3 Fatty Acids (FISH OIL PO), Take by mouth., Disp: , Rfl:    Semaglutide , 1 MG/DOSE, 4 MG/3ML SOPN, Inject 1 mg into the skin once a week., Disp: 9 mL, Rfl: 1

## 2024-08-29 NOTE — ED Notes (Signed)
 [facility] contacted to activate code stroke.   LKW: 8:30am today.   Stroke symptoms: slurred speech with left side facial droop.

## 2024-08-29 NOTE — ED Triage Notes (Signed)
 Pt states he recently got over the flu and feel like he is having stroke like sx.   Pt endorses numbness to right side of face that started 8:30. Pt states when he is eating food the taste is off which started a few days ago. Pt says he just doesn't feel right.

## 2024-08-29 NOTE — ED Notes (Signed)
 Pt to room 25 per [place] PA to be evaluated by EDP to determine if patient needs to be activated as a code stroke.

## 2024-08-29 NOTE — Discharge Instructions (Signed)
 Thank you for visiting the Emergency Department today. It was a pleasure to be part of your healthcare team.   Your were seen today for Bell's palsy  As discussed, rest, hydrate, resume diet as tolerated, monitor for any worsening or new neurological symptoms.  You have been treated with a steroid treatment pack, and you should take your medications as directed. If you have any questions about your medicines, please call your pharmacy or healthcare provider.  It is important to watch for warning signs such as new or worsening neurological symptoms.  If any of these happen, return to the nearest emergency department.  Thank you for trusting us  with your health.

## 2024-08-29 NOTE — ED Triage Notes (Addendum)
 Pt reports around 0800, he was eating and had difficulty, reports right side of his mouth wasn't opening like normal. Pt arrives AxOx4. There is notable facial asymmetry and some slurred speech. Reports some intermittent dizziness for the past couple of days, states he had the flu. Denies dizziness or headache at this time.

## 2024-08-29 NOTE — ED Notes (Signed)
 Megan PA at bedside, code stroke activated.

## 2024-08-29 NOTE — ED Notes (Signed)
 Sherlean, PA at bedside assessing patient

## 2024-08-30 LAB — LYME DISEASE SEROLOGY W/REFLEX: Lyme Total Antibody EIA: NEGATIVE

## 2024-09-06 ENCOUNTER — Ambulatory Visit: Payer: Self-pay | Admitting: Family Medicine

## 2024-09-08 ENCOUNTER — Ambulatory Visit: Admitting: Neurology

## 2024-09-08 ENCOUNTER — Encounter: Payer: Self-pay | Admitting: Neurology

## 2024-09-08 VITALS — BP 133/86 | HR 93 | Ht 74.0 in | Wt 304.6 lb

## 2024-09-08 DIAGNOSIS — E669 Obesity, unspecified: Secondary | ICD-10-CM

## 2024-09-08 DIAGNOSIS — Z9189 Other specified personal risk factors, not elsewhere classified: Secondary | ICD-10-CM

## 2024-09-08 DIAGNOSIS — R0683 Snoring: Secondary | ICD-10-CM | POA: Diagnosis not present

## 2024-09-08 DIAGNOSIS — G510 Bell's palsy: Secondary | ICD-10-CM

## 2024-09-08 MED ORDER — VALACYCLOVIR HCL 1 G PO TABS: 1000.0000 mg | ORAL_TABLET | Freq: Three times a day (TID) | ORAL | 0 refills | Status: AC

## 2024-09-08 NOTE — Patient Instructions (Signed)
" ° ° ° °  You have Bell's Palsy: weakness of your facial muscle due to irritation of your facial nerve on the left. Bell's palsy gets better with time. Continue doing your facial exercises. Use artificial tears to your affected eye during the day as needed and use a lubricant ointment in that eye at night and keep your eye closed at night by using an eye patch.  Consult with your eye doctor if you need any additional evaluation, especially if you develop eye pain and redness, gritty feeling or irritation or vision disturbance. The most dangerous complication from Bell's palsy is corneal infection or damage.  Treatment with prednisone : You have already been prescribed prednisone  with a tapering dose, please finish out the complete course.   We will treat you with empiric antiviral medication called Valtrex  1000 mg: take 1 pill three times a day for 7 days.  Side effects may include headaches, and in rare instances agitation and hallucinations.  As discussed, I usually recommend doing a brain MRI which I would be happy to order for you.  Based on your history and exam it is not imperative but it can certainly provide reassurance.  Let me know if you change your mind. I would be happy to order a sleep study or home sleep test for evaluation for sleep apnea as you may be at risk.  Let me know if you change your mind, I would be happy to order a sleep test.  At this juncture, you can follow-up in this clinic as needed.  I am glad to hear that you are facial weakness has improved already a little bit. "

## 2024-09-08 NOTE — Progress Notes (Signed)
 Subjective:    Patient ID: Scott Weiss is a 49 y.o. male.  HPI    True Mar, MD, PhD [facility] [address], [address] [address] [place], [place] [address]  I saw patient, Scott Weiss, as a referral from the emergency room for evaluation of his facial weakness, concern for Bell's palsy.  The patient is unaccompanied today.  Mr. Ohanesian is a 49 year old male with an underlying medical history of gout, diabetes, hypertension, hyperlipidemia, diverticulitis, and obesity,  who reports right facial weakness that started about 10 days ago.  He is already feeling a little better.  He was wondering why he was not given a prescription for an antiviral medication as he looked it up but he is somewhat on the mend, finishing up his prednisone  taper with another 2 more days left ago.  He has no other weakness, no headache, no worsening symptoms, no eye pain, does use lubricating eyedrops. He presented to the emergency room on 08/29/2022 with new onset numbness as the chief complaint.  He reported right sided facial droop and numbness.  He reported a preceding flulike illness a week prior.  He started having loss of taste on the right side of his mouth a few days prior to the facial weakness.  I reviewed the emergency room records.  Laboratory testing showed negative Lyme antibodies, Chem-8 was benign, INR 1.0, CBC showed elevated platelets at 427, otherwise benign, liver enzymes normal, alcohol level less than 15.  He was felt to have right-sided Bell's palsy.  He was started on a 5-day prednisone  treatment with a 5-day subsequent taper.  He was not given any antiviral medication.  Eye protection was discussed at the time.  He did not have any head imaging or brain imaging at the time.  A CT scan was canceled and code stroke was canceled. He lives with his wife, he is working on weight loss.  He has been on Ozempic .  He drinks caffeine in the form of soda, diet, about 24 ounce per day.   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 He does not drink any alcohol.  He is a non-smoker.  He is somewhat sensitive to sound with the dogs bark but otherwise he has no new symptoms.  Taste is coming back some. He does snore, he has never had a sleep study and declines sleep testing at this time.  He also declines a brain MRI.   His Past Medical History Is Significant For: Past Medical History:  Diagnosis Date   Diverticulitis    Gout     His Past Surgical History Is Significant For: Past Surgical History:  Procedure Laterality Date   HERNIA REPAIR     groin    His Family History Is Significant For: Family History  Problem Relation Age of Onset   Cancer Mother    Cirrhosis Mother    COPD Father    Liver cancer Sister    Bell's palsy Neg Hx     His Social History Is Significant For: Social History   Socioeconomic History   Marital status: Married    Spouse name: Not on file   Number of children: Not on file   Years of education: Not on file   Highest education level: Not on file  Occupational History   Not on file  Tobacco Use   Smoking status: Never   Smokeless tobacco: Never  Vaping Use   Vaping status: Never Used  Substance and Sexual Activity   Alcohol use: Never   Drug use: Never   Sexual activity: Yes  Other Topics Concern   Not on file  Social History Narrative   Pt lives with family   Pt works    Social Drivers of Health   Tobacco Use: Low Risk (09/08/2024)   Patient History    Smoking Tobacco Use: Never    Smokeless Tobacco Use: Never    Passive Exposure: Not on file  Financial Resource Strain: Not on file  Food Insecurity: Not on file  Transportation Needs: Not on file  Physical Activity: Not on file  Stress: Not on file  Social Connections: Not on file  Depression (PHQ2-9): Low Risk (06/03/2024)   Depression (PHQ2-9)    PHQ-2 Score: 4  Alcohol Screen: Not on file  Housing: Not on file  Utilities: Not on file   Health Literacy: Not on file    His Allergies Are:  Allergies[1]:   His Current Medications Are:  Outpatient Encounter Medications as of 09/08/2024  Medication Sig   Accu-Chek Softclix Lancets lancets USE TO CHECK SUGAR IN MORNING, AT NOON, AND AT BEDTIME   atorvastatin  (LIPITOR) 40 MG tablet Take 1 tablet (40 mg total) by mouth daily.   Blood Glucose Monitoring Suppl DEVI 1 each by Does not apply route in the morning, at noon, and at bedtime. May substitute to any manufacturer covered by patient's insurance.   Cholecalciferol (VITAMIN D3) 125 MCG (5000 UT) CAPS Take 10,000 Units by mouth.   CINNAMON PO Take by mouth.   Coenzyme Q10 (CO Q 10 PO) Take by mouth.   diclofenac  (VOLTAREN ) 75 MG EC tablet TAKE 1 TABLET BY MOUTH TWICE A DAY   glucose blood test strip Use as instructed dx E11.9   lisinopril  (ZESTRIL ) 40 MG tablet Take 1 tablet (40 mg total) by mouth daily.   metFORMIN  (GLUCOPHAGE ) 500 MG tablet Take 1 tablet (500 mg total) by mouth 2 (two) times daily with a meal.   Misc Natural Products (JOINT HEALTH PO) Take by mouth.   Multiple Vitamin (MULTIVITAMIN) tablet Take 1 tablet by mouth daily.   Omega-3 Fatty Acids (FISH OIL PO) Take by mouth.   predniSONE  (DELTASONE ) 20 MG tablet 60 mg daily for 5 days followed by 5-day taper   Semaglutide , 1 MG/DOSE, 4 MG/3ML SOPN Inject 1 mg into the skin once a week.   No facility-administered encounter medications on file as of 09/08/2024.  :   Review of Systems:  Out of a complete 14 point review of systems, all are reviewed and negative with the exception of these symptoms as listed below:  Review of Systems  Objective:  Neurological Exam  Physical Exam Physical Examination:   Vitals:   09/08/24 1042  BP: 133/86  Pulse: 93    General Examination: The patient is a very pleasant 49 y.o. male in no acute distress. He appears well-developed and well-nourished and well groomed.   HEENT: Normocephalic, atraumatic, pupils are  equal, round and reactive to light, tracking is well-preserved, face is mildly asymmetric with right-sided facial weakness including decreased in forehead wrinkling noted.  He has a very slight Bell's phenomenon on the right.  Speech is clear, tongue protrudes with slight deviation to the left because of mouth opening not completely symmetrical on the right.  Moderate airway crowding noted with larger uvula, tonsillar size 1-2+ on the right and 1+ on the left, thicker tongue.   Palate elevates symmetrically.  Hearing grossly intact,  tympanic membranes clear and ear canals clear, minimal wax bilaterally.  He does have excessive head sweating and sweating on his arms.    Chest: Clear to auscultation without wheezing, rhonchi or crackles noted.  Heart: S1+S2+0, regular and normal without murmurs, rubs or gallops noted.   Abdomen: Soft, non-tender and non-distended.  Extremities: There is no pitting edema in the distal lower extremities bilaterally.   Skin: Warm and dry without trophic changes noted.   Musculoskeletal: exam reveals no obvious joint deformities.   Neurologically:  Mental status: The patient is awake, alert and oriented in all 4 spheres. His immediate and remote memory, attention, language skills and fund of knowledge are appropriate. There is no evidence of aphasia, agnosia, apraxia or anomia. Speech is clear with normal prosody and enunciation. Thought process is linear. Mood is normal and affect is normal.  Cranial nerves II - XII are as described above under HEENT exam.  Motor exam: Normal bulk, strength and tone is noted. There is no obvious action or resting tremor.   Reflexes 1-2+ throughout including ankles, toes are downgoing bilaterally.  Romberg negative.  Fine motor skills and coordination: Intact grossly.  Cerebellar testing: No dysmetria or intention tremor. There is no truncal or gait ataxia.  Sensory exam: intact to light touch in the upper and lower extremities.   Gait, station and balance: He stands easily. No veering to one side is noted. No leaning to one side is noted. Posture is age-appropriate and stance is narrow based. Gait shows normal stride length and normal pace. No problems turning are noted.  Normal tandem walk.  Assessment and Plan:  In summary, Ezekial E Jewel is a very pleasant 49 year old male with an underlying medical history of gout, diabetes, hypertension, hyperlipidemia, diverticulitis, and obesity,  who presents for evaluation of his new onset right facial weakness with history and examination in keeping with right-sided Bell's palsy, with some improvement noted after he started prednisone .  He is finishing up his 10-day course of prednisone , he was not treated with an antiviral medication empirically through the ER but I would recommend treatment with Valtrex  as he is still within the first 14 days of symptom onset.  He is agreeable.  We talked about Bell's palsy, its prognosis and treatment options and complications.  Neurological exam otherwise is benign.  Nevertheless, I did offer him a brain MRI which he declined at this time and I also offered him sleep testing as he may be at risk for obstructive sleep apnea.  While there is no direct connection between obstructive sleep apnea and Bell's palsy, any neurological symptoms and conditions can get worse if there is underlying obstructive sleep apnea and the same is true for cardiac symptoms.  He declined sleep testing at this time.  He is advised to follow-up with his PCP as scheduled.  If he continues to improve over time, he is advised to follow-up in this clinic as needed.  I answered all his questions today and he was in agreement with our plan, agreeable to calling us  back or messaging us  if he would like to proceed with above test recommendations. Below is a summary of my recommendations and our discussion points from today's visit, based on chart review, history and examination. They were  given these instructions verbally during the visit in detail and also in writing in the MyChart after visit summary (AVS), which they can access electronically. <<   You have Bell's Palsy: weakness of your facial muscle due to  irritation of your facial nerve on the left. Bell's palsy gets better with time. Continue doing your facial exercises. Use artificial tears to your affected eye during the day as needed and use a lubricant ointment in that eye at night and keep your eye closed at night by using an eye patch.  Consult with your eye doctor if you need any additional evaluation, especially if you develop eye pain and redness, gritty feeling or irritation or vision disturbance. The most dangerous complication from Bell's palsy is corneal infection or damage.  Treatment with prednisone : You have already been prescribed prednisone  with a tapering dose, please finish out the complete course.   We will treat you with empiric antiviral medication called Valtrex  1000 mg: take 1 pill three times a day for 7 days.  Side effects may include headaches, and in rare instances agitation and hallucinations.  As discussed, I usually recommend doing a brain MRI which I would be happy to order for you.  Based on your history and exam it is not imperative but it can certainly provide reassurance.  Let me know if you change your mind. I would be happy to order a sleep study or home sleep test for evaluation for sleep apnea as you may be at risk.  Let me know if you change your mind, I would be happy to order a sleep test.  At this juncture, you can follow-up in this clinic as needed.  I am glad to hear that you are facial weakness has improved already a little bit.>>       [1] No Known Allergies

## 2024-09-27 ENCOUNTER — Ambulatory Visit: Admitting: Family Medicine
